# Patient Record
Sex: Female | Born: 1955 | Race: White | Hispanic: No | Marital: Married | Smoking: Never smoker
Health system: Southern US, Community
[De-identification: ages and names within clinical notes are randomized; demographics above are authoritative.]

## PROBLEM LIST (undated history)

## (undated) DIAGNOSIS — G8929 Other chronic pain: Secondary | ICD-10-CM

## (undated) DIAGNOSIS — M419 Scoliosis, unspecified: Secondary | ICD-10-CM

## (undated) DIAGNOSIS — M1990 Unspecified osteoarthritis, unspecified site: Secondary | ICD-10-CM

## (undated) DIAGNOSIS — M549 Dorsalgia, unspecified: Secondary | ICD-10-CM

## (undated) HISTORY — DX: Scoliosis, unspecified: M41.9

## (undated) HISTORY — DX: Other chronic pain: G89.29

## (undated) HISTORY — DX: Unspecified osteoarthritis, unspecified site: M19.90

## (undated) HISTORY — DX: Dorsalgia, unspecified: M54.9

---

## 1983-07-22 HISTORY — PX: FOOT SURGERY: SHX648

## 1998-07-26 ENCOUNTER — Other Ambulatory Visit: Admission: RE | Admit: 1998-07-26 | Discharge: 1998-07-26 | Payer: Self-pay | Admitting: Obstetrics and Gynecology

## 1999-03-15 ENCOUNTER — Emergency Department (HOSPITAL_COMMUNITY): Admission: EM | Admit: 1999-03-15 | Discharge: 1999-03-15 | Payer: Self-pay | Admitting: Emergency Medicine

## 1999-03-15 ENCOUNTER — Encounter: Payer: Self-pay | Admitting: Emergency Medicine

## 1999-03-17 ENCOUNTER — Emergency Department (HOSPITAL_COMMUNITY): Admission: EM | Admit: 1999-03-17 | Discharge: 1999-03-17 | Payer: Self-pay | Admitting: Endocrinology

## 2000-01-28 ENCOUNTER — Other Ambulatory Visit: Admission: RE | Admit: 2000-01-28 | Discharge: 2000-01-28 | Payer: Self-pay | Admitting: *Deleted

## 2001-07-13 ENCOUNTER — Other Ambulatory Visit: Admission: RE | Admit: 2001-07-13 | Discharge: 2001-07-13 | Payer: Self-pay | Admitting: Obstetrics and Gynecology

## 2002-07-20 ENCOUNTER — Encounter: Payer: Self-pay | Admitting: Emergency Medicine

## 2002-07-20 ENCOUNTER — Emergency Department (HOSPITAL_COMMUNITY): Admission: EM | Admit: 2002-07-20 | Discharge: 2002-07-20 | Payer: Self-pay | Admitting: Emergency Medicine

## 2003-03-10 ENCOUNTER — Encounter: Payer: Self-pay | Admitting: Obstetrics and Gynecology

## 2003-03-10 ENCOUNTER — Encounter: Admission: RE | Admit: 2003-03-10 | Discharge: 2003-03-10 | Payer: Self-pay | Admitting: Obstetrics and Gynecology

## 2003-03-10 ENCOUNTER — Other Ambulatory Visit: Admission: RE | Admit: 2003-03-10 | Discharge: 2003-03-10 | Payer: Self-pay | Admitting: Obstetrics and Gynecology

## 2003-06-19 ENCOUNTER — Encounter: Admission: RE | Admit: 2003-06-19 | Discharge: 2003-06-19 | Payer: Self-pay | Admitting: *Deleted

## 2004-04-17 ENCOUNTER — Encounter: Admission: RE | Admit: 2004-04-17 | Discharge: 2004-04-17 | Payer: Self-pay | Admitting: Obstetrics and Gynecology

## 2005-05-05 ENCOUNTER — Ambulatory Visit: Payer: Self-pay | Admitting: Internal Medicine

## 2005-05-29 ENCOUNTER — Encounter: Admission: RE | Admit: 2005-05-29 | Discharge: 2005-05-29 | Payer: Self-pay | Admitting: Obstetrics and Gynecology

## 2005-11-12 DIAGNOSIS — F32A Depression, unspecified: Secondary | ICD-10-CM | POA: Insufficient documentation

## 2005-11-12 DIAGNOSIS — H9190 Unspecified hearing loss, unspecified ear: Secondary | ICD-10-CM | POA: Insufficient documentation

## 2005-12-10 DIAGNOSIS — G4700 Insomnia, unspecified: Secondary | ICD-10-CM | POA: Insufficient documentation

## 2006-06-02 ENCOUNTER — Encounter: Admission: RE | Admit: 2006-06-02 | Discharge: 2006-06-02 | Payer: Self-pay | Admitting: Obstetrics and Gynecology

## 2007-08-20 ENCOUNTER — Encounter: Admission: RE | Admit: 2007-08-20 | Discharge: 2007-08-20 | Payer: Self-pay | Admitting: Obstetrics and Gynecology

## 2008-09-27 DIAGNOSIS — R002 Palpitations: Secondary | ICD-10-CM | POA: Insufficient documentation

## 2009-05-07 ENCOUNTER — Encounter: Admission: RE | Admit: 2009-05-07 | Discharge: 2009-05-07 | Payer: Self-pay | Admitting: Obstetrics and Gynecology

## 2009-07-24 ENCOUNTER — Ambulatory Visit: Payer: Self-pay | Admitting: Family Medicine

## 2010-05-14 ENCOUNTER — Encounter: Admission: RE | Admit: 2010-05-14 | Discharge: 2010-05-14 | Payer: Self-pay | Admitting: Obstetrics and Gynecology

## 2010-08-11 ENCOUNTER — Encounter: Payer: Self-pay | Admitting: Obstetrics and Gynecology

## 2011-05-27 ENCOUNTER — Other Ambulatory Visit: Payer: Self-pay | Admitting: Obstetrics and Gynecology

## 2011-05-27 DIAGNOSIS — Z1231 Encounter for screening mammogram for malignant neoplasm of breast: Secondary | ICD-10-CM

## 2011-06-23 ENCOUNTER — Ambulatory Visit
Admission: RE | Admit: 2011-06-23 | Discharge: 2011-06-23 | Disposition: A | Discharge status: Home / Self Care | Payer: BC Managed Care – PPO | Source: Ambulatory Visit | Attending: Obstetrics and Gynecology | Admitting: Obstetrics and Gynecology

## 2011-06-23 DIAGNOSIS — Z1231 Encounter for screening mammogram for malignant neoplasm of breast: Secondary | ICD-10-CM

## 2011-09-10 LAB — HM PAP SMEAR: HM Pap smear: NEGATIVE

## 2012-11-15 ENCOUNTER — Other Ambulatory Visit: Payer: Self-pay

## 2012-11-15 DIAGNOSIS — Z1231 Encounter for screening mammogram for malignant neoplasm of breast: Secondary | ICD-10-CM

## 2012-12-01 ENCOUNTER — Ambulatory Visit
Admission: RE | Admit: 2012-12-01 | Discharge: 2012-12-01 | Disposition: A | Discharge status: Home / Self Care | Payer: BC Managed Care – PPO | Source: Ambulatory Visit

## 2012-12-01 DIAGNOSIS — Z1231 Encounter for screening mammogram for malignant neoplasm of breast: Secondary | ICD-10-CM

## 2013-12-05 ENCOUNTER — Ambulatory Visit: Payer: Self-pay | Admitting: Family Medicine

## 2013-12-05 LAB — COMPREHENSIVE METABOLIC PANEL
Albumin: 4.1 g/dL (ref 3.4–5.0)
Alkaline Phosphatase: 92 U/L
Anion Gap: 2 — ABNORMAL LOW (ref 7–16)
BUN: 11 mg/dL (ref 7–18)
Bilirubin,Total: 0.7 mg/dL (ref 0.2–1.0)
Calcium, Total: 9.4 mg/dL (ref 8.5–10.1)
Chloride: 105 mmol/L (ref 98–107)
Co2: 31 mmol/L (ref 21–32)
Creatinine: 0.84 mg/dL (ref 0.60–1.30)
EGFR (African American): >60
EGFR (Non-African Amer.): >60
Glucose: 90 mg/dL (ref 65–99)
Osmolality: 275 (ref 275–301)
Potassium: 4.1 mmol/L (ref 3.5–5.1)
SGOT(AST): 19 U/L (ref 15–37)
SGPT (ALT): 31 U/L (ref 12–78)
Sodium: 138 mmol/L (ref 136–145)
Total Protein: 7.5 g/dL (ref 6.4–8.2)

## 2013-12-05 LAB — CBC WITH DIFFERENTIAL/PLATELET
Basophil #: 0 10*3/uL (ref 0.0–0.1)
Basophil %: 0.6 %
Eosinophil #: 0.1 10*3/uL (ref 0.0–0.7)
Eosinophil %: 1.7 %
HCT: 43.6 % (ref 35.0–47.0)
HGB: 14.5 g/dL (ref 12.0–16.0)
Lymphocyte #: 1.6 10*3/uL (ref 1.0–3.6)
Lymphocyte %: 18.6 %
MCH: 30.9 pg (ref 26.0–34.0)
MCHC: 33.3 g/dL (ref 32.0–36.0)
MCV: 93 fL (ref 80–100)
Monocyte #: 0.6 x10 3/mm (ref 0.2–0.9)
Monocyte %: 7 %
Neutrophil #: 6.3 10*3/uL (ref 1.4–6.5)
Neutrophil %: 72.1 %
Platelet: 251 10*3/uL (ref 150–440)
RBC: 4.69 10*6/uL (ref 3.80–5.20)
RDW: 12.3 % (ref 11.5–14.5)
WBC: 8.8 10*3/uL (ref 3.6–11.0)

## 2013-12-05 LAB — TSH: Thyroid Stimulating Horm: 1.9 u[IU]/mL

## 2013-12-13 DIAGNOSIS — R079 Chest pain, unspecified: Secondary | ICD-10-CM

## 2013-12-13 DIAGNOSIS — R002 Palpitations: Secondary | ICD-10-CM

## 2013-12-19 ENCOUNTER — Encounter: Payer: Self-pay | Admitting: Cardiovascular Disease

## 2014-01-03 ENCOUNTER — Other Ambulatory Visit: Payer: Self-pay

## 2014-01-03 DIAGNOSIS — Z1231 Encounter for screening mammogram for malignant neoplasm of breast: Secondary | ICD-10-CM

## 2014-01-06 ENCOUNTER — Ambulatory Visit
Admission: RE | Admit: 2014-01-06 | Discharge: 2014-01-06 | Disposition: A | Discharge status: Home / Self Care | Payer: BC Managed Care – PPO | Source: Ambulatory Visit

## 2014-01-06 DIAGNOSIS — Z1231 Encounter for screening mammogram for malignant neoplasm of breast: Secondary | ICD-10-CM

## 2015-05-03 DIAGNOSIS — G8929 Other chronic pain: Secondary | ICD-10-CM | POA: Insufficient documentation

## 2015-05-03 DIAGNOSIS — R079 Chest pain, unspecified: Secondary | ICD-10-CM | POA: Insufficient documentation

## 2015-05-03 DIAGNOSIS — F4322 Adjustment disorder with anxiety: Secondary | ICD-10-CM | POA: Insufficient documentation

## 2015-05-03 DIAGNOSIS — M549 Dorsalgia, unspecified: Secondary | ICD-10-CM

## 2015-05-04 ENCOUNTER — Ambulatory Visit (INDEPENDENT_AMBULATORY_CARE_PROVIDER_SITE_OTHER): Payer: BLUE CROSS/BLUE SHIELD | Admitting: Family Medicine

## 2015-05-04 ENCOUNTER — Encounter: Payer: Self-pay | Admitting: Family Medicine

## 2015-05-04 VITALS — BP 110/72 | HR 68 | Temp 98.5°F | Resp 16 | Ht 68.5 in | Wt 181.0 lb

## 2015-05-04 DIAGNOSIS — G4700 Insomnia, unspecified: Secondary | ICD-10-CM

## 2015-05-04 DIAGNOSIS — M1990 Unspecified osteoarthritis, unspecified site: Secondary | ICD-10-CM

## 2015-05-04 MED ORDER — MELOXICAM 15 MG PO TABS: 15.0000 mg | ORAL_TABLET | Freq: Every day | ORAL | Status: DC

## 2015-05-04 NOTE — Progress Notes (Signed)
Subjective:    Patient ID: Robyn Roberts, female    DOB: Feb 26, 1956, 59 y.o.   MRN: [id number]  HPI  Joint/Muscle Pain: Patient complains of arthralgias for which has been present for several months. Pain is located in multiple joints (bilateral thumbs, back, neck, hips), is described as tight band, and is constant . Thumbs are the worse pain this week. Neck cracking  Not worse in the am.   Associated symptoms include: crepitation and decreased range of motion.  The patient has tried NSAIDs for pain, with moderate relief.  Related to injury:   no. No family history of rheumatoid arthritis, though there is a family h/o osteoarthritis. Has taken Ibuprofen every day.  Also, doing well on Trazodone for sleep. Has stopped Metoprolol and Lexapro as anxiety has resolved since passing test.   Did not need medication anymore. Working as Statistician.      Review of Systems  Constitutional: Negative for fever, chills, diaphoresis, activity change, appetite change, fatigue and unexpected weight change.  Respiratory: Negative for cough, shortness of breath and wheezing.   Cardiovascular: Negative for chest pain, palpitations and leg swelling.  Musculoskeletal: Positive for back pain, arthralgias, neck pain and neck stiffness. Negative for joint swelling.   BP 110/72 mmHg  Pulse 68  Temp(Src) 98.5 F (36.9 C) (Oral)  Resp 16  Ht 5' 8.5" (1.74 m)  Wt 181 lb (82.101 kg)  BMI 27.12 kg/m2   Patient Active Problem List   Diagnosis Date Noted  . Adjustment disorder with anxiety 05/03/2015  . Back ache 05/03/2015  . Chest pain 05/03/2015  . Awareness of heartbeats 09/27/2008  . Cannot sleep 12/10/2005  . Clinical depression 11/12/2005  . Difficulty hearing 11/12/2005   No past medical history on file. Current Outpatient Prescriptions on File Prior to Visit  Medication Sig  . MULTIPLE VITAMIN PO Take 1 tablet by mouth daily.  . traZODone (DESYREL) 100 MG tablet Take 1 tablet by mouth  at bedtime.  . fexofenadine (ALLEGRA ALLERGY) 180 MG tablet Take 1 tablet by mouth daily.  . metoprolol tartrate (LOPRESSOR) 25 MG tablet Take 1 tablet by mouth 2 (two) times daily.   No current facility-administered medications on file prior to visit.   Allergies  Allergen Reactions  . Aspirin Swelling  . Penicillins Swelling  . Sulfa Antibiotics Hives   Past Surgical History  Procedure Laterality Date  . Cesarean section  1991  . Foot surgery Bilateral 1985    bunionectomy   Social History   Social History  . Marital Status: Married    Spouse Name: N/A  . Number of Children: N/A  . Years of Education: N/A   Occupational History  . Not on file.   Social History Main Topics  . Smoking status: Never Smoker   . Smokeless tobacco: Never Used  . Alcohol Use: No  . Drug Use: No  . Sexual Activity: Not on file   Other Topics Concern  . Not on file   Social History Narrative   Family History  Problem Relation Age of Onset  . Heart disease Mother   . Cancer Mother     cancer  . Colon polyps Mother   . Cancer Father     colon  . Diabetes Father   . Depression Father   . CVA Father       Objective:   Physical Exam  Constitutional: She appears well-developed and well-nourished.  HENT:  Head: Normocephalic and atraumatic.  Eyes: Conjunctivae  are normal. Pupils are equal, round, and reactive to light.  Neck: Normal range of motion. Neck supple.  Cardiovascular: Normal rate and regular rhythm.   Pulmonary/Chest: Effort normal and breath sounds normal.  Musculoskeletal: Normal range of motion. She exhibits no edema.  Some tenderness at first MCP joints.  Some swelling.   No erythema.  Strength intact.   Lymphadenopathy:    She has no cervical adenopathy.  Skin: Skin is warm and dry. Rash (Around nails from picking. ) noted.  BP 110/72 mmHg  Pulse 68  Temp(Src) 98.5 F (36.9 C) (Oral)  Resp 16  Ht 5' 8.5" (1.74 m)  Wt 181 lb (82.101 kg)  BMI 27.12 kg/m2      Assessment & Plan:  1. Arthritis New problem. Suspects osteoarthritis, but will check for other underlying etiologies.  Will also start anti-inflammatory to see if helps.  Sent in rx for Mobic. - CBC with Differential/Platelet - Comprehensive metabolic panel - CYCLIC CITRUL PEPTIDE ANTIBODY, IGG/IGA - Rheumatoid Factor - Sed Rate (ESR)  2. Cannot sleep Stable on Trazodone. Stable. Follow up as needed.   Margarita Rana, MD

## 2015-05-06 LAB — CBC WITH DIFFERENTIAL/PLATELET
Basophils Absolute: 0 10*3/uL (ref 0.0–0.2)
Basos: 0 %
EOS (ABSOLUTE): 0.2 10*3/uL (ref 0.0–0.4)
Eos: 2 %
Hematocrit: 43.8 % (ref 34.0–46.6)
Hemoglobin: 14.6 g/dL (ref 11.1–15.9)
Immature Grans (Abs): 0 10*3/uL (ref 0.0–0.1)
Immature Granulocytes: 0 %
Lymphocytes Absolute: 1.6 10*3/uL (ref 0.7–3.1)
Lymphs: 18 %
MCH: 30.8 pg (ref 26.6–33.0)
MCHC: 33.3 g/dL (ref 31.5–35.7)
MCV: 92 fL (ref 79–97)
Monocytes Absolute: 0.7 10*3/uL (ref 0.1–0.9)
Monocytes: 7 %
Neutrophils Absolute: 6.6 10*3/uL (ref 1.4–7.0)
Neutrophils: 73 %
Platelets: 287 10*3/uL (ref 150–379)
RBC: 4.74 x10E6/uL (ref 3.77–5.28)
RDW: 12.5 % (ref 12.3–15.4)
WBC: 9.2 10*3/uL (ref 3.4–10.8)

## 2015-05-06 LAB — COMPREHENSIVE METABOLIC PANEL
ALT: 40 IU/L — ABNORMAL HIGH (ref 0–32)
AST: 21 IU/L (ref 0–40)
Albumin/Globulin Ratio: 1.8 (ref 1.1–2.5)
Albumin: 4.3 g/dL (ref 3.5–5.5)
Alkaline Phosphatase: 109 IU/L (ref 39–117)
BUN/Creatinine Ratio: 17 (ref 9–23)
BUN: 15 mg/dL (ref 6–24)
Bilirubin Total: 0.5 mg/dL (ref 0.0–1.2)
CO2: 25 mmol/L (ref 18–29)
Calcium: 9.6 mg/dL (ref 8.7–10.2)
Chloride: 102 mmol/L (ref 97–108)
Creatinine, Ser: 0.87 mg/dL (ref 0.57–1.00)
GFR calc Af Amer: 86 mL/min/{1.73_m2} (ref >59)
GFR calc non Af Amer: 74 mL/min/{1.73_m2} (ref >59)
Globulin, Total: 2.4 g/dL (ref 1.5–4.5)
Glucose: 103 mg/dL — ABNORMAL HIGH (ref 65–99)
Potassium: 4.7 mmol/L (ref 3.5–5.2)
Sodium: 143 mmol/L (ref 134–144)
Total Protein: 6.7 g/dL (ref 6.0–8.5)

## 2015-05-06 LAB — RHEUMATOID FACTOR: Rhuematoid fact SerPl-aCnc: <10 IU/mL (ref 0.0–13.9)

## 2015-05-06 LAB — CYCLIC CITRUL PEPTIDE ANTIBODY, IGG/IGA: Cyclic Citrullin Peptide Ab: 9 units (ref 0–19)

## 2015-05-06 LAB — SEDIMENTATION RATE: Sed Rate: 2 mm/hr (ref 0–40)

## 2015-05-08 ENCOUNTER — Telehealth: Payer: Self-pay

## 2015-05-08 NOTE — Telephone Encounter (Signed)
-----   Message from Lorie PhenixNancy Maloney, MD sent at 05/06/2015 10:35 AM EDT ----- Labs stable except for one elevated liver enzyme.  No signs of inflammatory arthritis.  Liver enzymes often return to normal on their own.  No tylenol or ETOH and recheck in one month.

## 2015-05-08 NOTE — Telephone Encounter (Signed)
Pt advised as directed below.  She is going to call in about a month to get her lab sheet.   Thanks,   -Vernona RiegerLaura

## 2015-05-08 NOTE — Telephone Encounter (Signed)
Tried calling; no answer 05/08/2015  Thanks,   -Troi Bechtold  

## 2015-08-18 ENCOUNTER — Other Ambulatory Visit: Payer: Self-pay | Admitting: Family Medicine

## 2015-08-18 DIAGNOSIS — G4700 Insomnia, unspecified: Secondary | ICD-10-CM

## 2015-09-10 ENCOUNTER — Other Ambulatory Visit: Payer: Self-pay

## 2015-09-10 MED ORDER — MELOXICAM 15 MG PO TABS: 15.0000 mg | ORAL_TABLET | Freq: Every day | ORAL | Status: DC

## 2015-09-10 NOTE — Telephone Encounter (Signed)
Mail order pharmacy requesting refill of 90 day supply. Last OV was 05/04/2015. Med was last filled on 05/04/2015 #30 with 5 RF.

## 2015-09-24 ENCOUNTER — Other Ambulatory Visit: Payer: Self-pay

## 2015-09-24 DIAGNOSIS — Z1231 Encounter for screening mammogram for malignant neoplasm of breast: Secondary | ICD-10-CM

## 2015-10-05 ENCOUNTER — Ambulatory Visit
Admission: RE | Admit: 2015-10-05 | Discharge: 2015-10-05 | Disposition: A | Discharge status: Home / Self Care | Payer: BLUE CROSS/BLUE SHIELD | Source: Ambulatory Visit

## 2015-10-05 DIAGNOSIS — Z1231 Encounter for screening mammogram for malignant neoplasm of breast: Secondary | ICD-10-CM

## 2015-10-09 ENCOUNTER — Telehealth: Payer: Self-pay

## 2015-10-09 ENCOUNTER — Telehealth: Payer: Self-pay | Admitting: Family Medicine

## 2015-10-09 NOTE — Telephone Encounter (Signed)
Patient called saying that she has neck pain that started about 1 week ago. She feels like her pain is coming from the Meloxicam. She reports that she has been on Meloxicam since October. She reports that she intially got her medication [facility] at [facility] in [place] and she received a round pill. She now gets it through mail order, and reports that it is an oval shaped pill. She has been on the oval pill for the last 3 weeks. She thinks the medication change is causing her symptoms. I suggested that patient stop the Meloxicam for a few days to see if her neck pain resolves (what do you think?). Patient takes Meloxicam for arthritis in her hands. What can she take in the mean time to help with pain being that she is allergic to aspirin? I thought of Aleve but I wasn't sure. Please advise. Thanks!

## 2015-10-09 NOTE — Telephone Encounter (Signed)
Pt has been taking melexacom for several months with no side effects.  In the last couple days she has been getting a discomfort in the neck area.

## 2015-10-09 NOTE — Telephone Encounter (Signed)
Likely arthritis or muscle spasm and not the medication. Can stop and see if improves. Would just try Tylenol for pain. OV if worsens or does not improve. Thanks.

## 2015-10-10 NOTE — Telephone Encounter (Signed)
Pt returned your call. ° °ThanksTeri °

## 2015-10-10 NOTE — Telephone Encounter (Signed)
Tried calling patient, no answer. Will try again later.  

## 2015-10-11 NOTE — Telephone Encounter (Signed)
Tried calling patient and voicemail has not been set up. Will try again later.  

## 2015-10-19 NOTE — Telephone Encounter (Signed)
Tried calling patient and voicemail has not been set up. Will try again later.  

## 2015-10-25 NOTE — Telephone Encounter (Signed)
Tried calling patient and was unsuccessful on several attempts. Unable to leave message regarding below.

## 2015-10-30 DIAGNOSIS — D224 Melanocytic nevi of scalp and neck: Secondary | ICD-10-CM | POA: Diagnosis not present

## 2015-10-30 DIAGNOSIS — L821 Other seborrheic keratosis: Secondary | ICD-10-CM | POA: Diagnosis not present

## 2015-10-30 DIAGNOSIS — L814 Other melanin hyperpigmentation: Secondary | ICD-10-CM | POA: Diagnosis not present

## 2016-03-10 ENCOUNTER — Ambulatory Visit (INDEPENDENT_AMBULATORY_CARE_PROVIDER_SITE_OTHER): Payer: BLUE CROSS/BLUE SHIELD | Admitting: Family Medicine

## 2016-03-10 ENCOUNTER — Encounter: Payer: Self-pay | Admitting: Family Medicine

## 2016-03-10 VITALS — BP 122/80 | HR 80 | Temp 97.9°F | Resp 16 | Wt 183.0 lb

## 2016-03-10 DIAGNOSIS — J069 Acute upper respiratory infection, unspecified: Secondary | ICD-10-CM

## 2016-03-10 MED ORDER — HYDROCODONE-HOMATROPINE 5-1.5 MG/5ML PO SYRP: ORAL_SOLUTION | ORAL | 0 refills | Status: DC

## 2016-03-10 NOTE — Progress Notes (Signed)
Subjective:     Patient ID: Robyn Roberts, female   DOB: 17-Apr-1956, 60 y.o.   MRN: [id number]  HPI  Chief Complaint  Patient presents with  . URI    x 5 days. Afebrile. Cough (dry), sore throat, scratchy ears. Pt has tried Robitussin and Ibuprofen, with mild relief.     Review of Systems  Constitutional: Negative for chills and fever.       Objective:   Physical Exam  Constitutional: She appears well-developed and well-nourished. No distress.  Ears: T.M's intact without inflammation Throat: no tonsillar enlargement or exudate Neck: no cervical adenopathy Lungs: clear    Assessment:    1. Upper respiratory infection - HYDROcodone-homatropine (HYCODAN) 5-1.5 MG/5ML syrup; 5 ml 4-6 hours as needed for cough  Dispense: 240 mL; Refill: 0    Plan:    Discussed use of otc medication.

## 2016-03-10 NOTE — Patient Instructions (Signed)
Discussed use of Mucinex D for congestion, Delsym for cough, and Benadryl for postnasal drainage 

## 2016-04-03 ENCOUNTER — Ambulatory Visit (INDEPENDENT_AMBULATORY_CARE_PROVIDER_SITE_OTHER): Payer: BLUE CROSS/BLUE SHIELD | Admitting: Family Medicine

## 2016-04-03 ENCOUNTER — Encounter: Payer: Self-pay | Admitting: Family Medicine

## 2016-04-03 VITALS — BP 115/79 | HR 67 | Ht 68.25 in | Wt 179.6 lb

## 2016-04-03 DIAGNOSIS — G4700 Insomnia, unspecified: Secondary | ICD-10-CM | POA: Diagnosis not present

## 2016-04-03 DIAGNOSIS — J3089 Other allergic rhinitis: Secondary | ICD-10-CM | POA: Diagnosis not present

## 2016-04-03 DIAGNOSIS — Z800 Family history of malignant neoplasm of digestive organs: Secondary | ICD-10-CM

## 2016-04-03 DIAGNOSIS — E663 Overweight: Secondary | ICD-10-CM

## 2016-04-03 DIAGNOSIS — M159 Polyosteoarthritis, unspecified: Secondary | ICD-10-CM

## 2016-04-03 DIAGNOSIS — M4120 Other idiopathic scoliosis, site unspecified: Secondary | ICD-10-CM | POA: Insufficient documentation

## 2016-04-03 HISTORY — DX: Family history of malignant neoplasm of digestive organs: Z80.0

## 2016-04-03 NOTE — Patient Instructions (Addendum)
Melatonin 3mg  to start- up to 15mg  and also take trazadone current dose.    Sleep meditation on youtube.:   Guided meditation for detachment from overthinking by Kristopher Glee, hypnosis for clearing subconscious negativity by Kristopher Glee and guided meditation for sleep floating amongst the stars by Rayvon Char     If you have insomnia or difficulty sleeping, this information is for you:  - Avoid caffeinated beverages after lunch,  no alcoholic beverages,  no eating within 2-3 hours of lying down,  avoid exposure to blue light before bed,  avoid daytime naps, and  needs to maintain a regular sleep schedule- go to sleep and wake up around the same time every night.   - Resolve concerns or worries before entering bedroom:  Discussed relaxation techniques with patient and to keep a journal to write down fears\ worries.  I suggested seeing a counselor for CBT.   - Recommend patient meditate or do deep breathing exercises to help relax.   Incorporate the use of white noise machines or listen to "sleep meditation music", or recordings of guided meditations for sleep from YouTube which are free, such as  "guided meditation for detachment from over thinking"  by Ina Kick.       Insomnia Insomnia is a sleep disorder that makes it difficult to fall asleep or to stay asleep. Insomnia can cause tiredness (fatigue), low energy, difficulty concentrating, mood swings, and poor performance at work or school.  There are three different ways to classify insomnia:  Difficulty falling asleep.  Difficulty staying asleep.  Waking up too early in the morning. Any type of insomnia can be long-term (chronic) or short-term (acute). Both are common. Short-term insomnia usually lasts for three months or less. Chronic insomnia occurs at least three times a week for longer than three months. CAUSES  Insomnia may be caused by another condition, situation, or substance, such as:  Anxiety.  Certain  medicines.  Gastroesophageal reflux disease (GERD) or other gastrointestinal conditions.  Asthma or other breathing conditions.  Restless legs syndrome, sleep apnea, or other sleep disorders.  Chronic pain.  Menopause. This may include hot flashes.  Stroke.  Abuse of alcohol, tobacco, or illegal drugs.  Depression.  Caffeine.   Neurological disorders, such as Alzheimer disease.  An overactive thyroid (hyperthyroidism). The cause of insomnia may not be known. RISK FACTORS Risk factors for insomnia include:  Gender. Women are more commonly affected than men.  Age. Insomnia is more common as you get older.  Stress. This may involve your professional or personal life.  Income. Insomnia is more common in people with lower income.  Lack of exercise.   Irregular work schedule or night shifts.  Traveling between different time zones. SIGNS AND SYMPTOMS If you have insomnia, trouble falling asleep or trouble staying asleep is the main symptom. This may lead to other symptoms, such as:  Feeling fatigued.  Feeling nervous about going to sleep.  Not feeling rested in the morning.  Having trouble concentrating.  Feeling irritable, anxious, or depressed. TREATMENT  Treatment for insomnia depends on the cause. If your insomnia is caused by an underlying condition, treatment will focus on addressing the condition. Treatment may also include:   Medicines to help you sleep.  Counseling or therapy.  Lifestyle adjustments. HOME CARE INSTRUCTIONS   Take medicines only as directed by your health care provider.  Keep regular sleeping and waking hours. Avoid naps.  Keep a sleep diary to help you and your health care provider  figure out what could be causing your insomnia. Include:   When you sleep.  When you wake up during the night.  How well you sleep.   How rested you feel the next day.  Any side effects of medicines you are taking.  What you eat and  drink.   Make your bedroom a comfortable place where it is easy to fall asleep:  Put up shades or special blackout curtains to block light from outside.  Use a white noise machine to block noise.  Keep the temperature cool.   Exercise regularly as directed by your health care provider. Avoid exercising right before bedtime.  Use relaxation techniques to manage stress. Ask your health care provider to suggest some techniques that may work well for you. These may include:  Breathing exercises.  Routines to release muscle tension.  Visualizing peaceful scenes.  Cut back on alcohol, caffeinated beverages, and cigarettes, especially close to bedtime. These can disrupt your sleep.  Do not overeat or eat spicy foods right before bedtime. This can lead to digestive discomfort that can make it hard for you to sleep.  Limit screen use before bedtime. This includes:  Watching TV.  Using your smartphone, tablet, and computer.  Stick to a routine. This can help you fall asleep faster. Try to do a quiet activity, brush your teeth, and go to bed at the same time each night.  Get out of bed if you are still awake after 15 minutes of trying to sleep. Keep the lights down, but try reading or doing a quiet activity. When you feel sleepy, go back to bed.  Make sure that you drive carefully. Avoid driving if you feel very sleepy.  Keep all follow-up appointments as directed by your health care provider. This is important. SEEK MEDICAL CARE IF:   You are tired throughout the day or have trouble in your daily routine due to sleepiness.  You continue to have sleep problems or your sleep problems get worse. SEEK IMMEDIATE MEDICAL CARE IF:   You have serious thoughts about hurting yourself or someone else.   This information is not intended to replace advice given to you by your health care provider. Make sure you discuss any questions you have with your health care provider.   Document  Released: 07/04/2000 Document Revised: 03/28/2015 Document Reviewed: 04/07/2014 Elsevier Interactive Patient Education [employer]2016 Elsevier Inc.

## 2016-04-03 NOTE — Assessment & Plan Note (Signed)
Been on trazadone many yrs.  Last 1 yr- little help. Takes forever to fall sleep and freq awakenings.  Longest time is 3 hrs sleeping.  No history OSA or eval for that but snores.

## 2016-04-03 NOTE — Assessment & Plan Note (Deleted)
In BL hands

## 2016-04-03 NOTE — Progress Notes (Signed)
New patient office visit note:  Impression and Recommendations:    1. Overweight (BMI 25.0-29.9)   2. Insomnia   3. Family history of colon cancer- father age late 32's   4. Environmental and seasonal allergies   5. Generalized OA- hands, back, neck, hips, knees     Insomnia Been on trazadone many yrs.  Last 1 yr- little help. Takes forever to fall sleep and freq awakenings.  Longest time is 3 hrs sleeping.  No history OSA or eval for that but snores.    up-to-date on her colonoscopy per patient- asked her to get me these medical records  Environmental seasonal allergies- advised Nettie pot twice a day, Flonase 1 squirt after each saline rinse of her sinuses. Can continue over-the-counter medications- Allegra   Arthritis-multi joint and most prominent in hands today. Patient has used Mobic with decent control. She does not exercise regularly.   Orders Placed This Encounter  Procedures  . CBC with Differential/Platelet  . COMPLETE METABOLIC PANEL WITH GFR  . Lipid panel  . TSH  . VITAMIN D 25 Hydroxy (Vit-D Deficiency, Fractures)  . Hemoglobin A1c      New Prescriptions   No medications on file    Modified Medications   No medications on file    Discontinued Medications   HYDROCODONE-HOMATROPINE (HYCODAN) 5-1.5 MG/5ML SYRUP    5 ml 4-6 hours as needed for cough   The patient was counseled, risk factors were discussed, anticipatory guidance given.  Gross side effects, risk and benefits, and alternatives of medications discussed with patient.  Patient is aware that all medications have potential side effects and we are unable to predict every side effect or drug-drug interaction that may occur.  Expresses verbal understanding and consents to current therapy plan and treatment regimen.  Return in about 4 weeks (around 05/01/2016) for Fasting blood work, For wellness exam & health maintenance evaluation.  Please see AVS handed out to patient at the end of  our visit for further patient instructions/ counseling done pertaining to today's office visit.    Note: This document was prepared using Dragon voice recognition software and may include unintentional dictation errors.  ----------------------------------------------------------------------------------------------------------------------    Subjective:    Chief Complaint  Patient presents with  . Establish Care    HPI: Robyn Roberts is a pleasant 60 y.o. female who presents to [facility] today to review their medical history with me and establish care.    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 Married to [place]- 1 D- 59yo and 1 mo old granddaughter.   No history smoking. Does not drink, no recreational drugs. Only sexually active with husband. No regular exercise.  Social, family, surgical, personal medical and social economic history reviewed with patient.   Primary complaint today is with her sleep. Has had difficulty for some time now. She had done well on trazodone for many years.   History of anxiety which she took Lexapro and metoprolol   Complains of arthritis in both hands, back, neck, hips - is multi-joint.. This is been going on for years. Rheumatoid workup was negative.     Wt Readings from Last 3 Encounters:  04/03/16 179 lb 9.6 oz (81.5 kg)  03/10/16 183 lb (83 kg)  05/04/15 181 lb (82.1 kg)   BP Readings from Last 3 Encounters:  04/03/16 115/79  03/10/16 122/80  05/04/15 110/72   Pulse Readings from Last 3  Encounters:  04/03/16 67  03/10/16 80  05/04/15 68   BMI Readings from Last 3 Encounters:  04/03/16 27.11 kg/m  03/10/16 27.42 kg/m  05/04/15 27.12 kg/m      Patient Active Problem List   Diagnosis Date Noted  . Environmental and seasonal allergies 04/05/2016  . Generalized OA- hands, back, neck, hips, knees 04/05/2016  . Overweight (BMI 25.0-29.9) 04/05/2016  . Idiopathic scoliosis  04/03/2016  . Family history of colon cancer- father age late 6050's 04/03/2016  . Chronic back pain 05/03/2015  . Insomnia 12/10/2005  . Difficulty hearing 11/12/2005     Past Medical History:  Diagnosis Date  . Arthritis   . Chronic back pain   . Scoliosis      Past Medical History:  Diagnosis Date  . Arthritis   . Chronic back pain   . Scoliosis      Past Surgical History:  Procedure Laterality Date  . CESAREAN SECTION  1991  . FOOT SURGERY Bilateral 1985   bunionectomy     Family History  Problem Relation Age of Onset  . Heart disease Mother   . Colon polyps Mother   . Cancer Mother     thyroid  . Asthma Mother   . Cancer Father     colon  . Diabetes Father   . Depression Father   . CVA Father      History  Drug Use No    History  Alcohol Use  . Yes    Comment: rare    History  Smoking Status  . Never Smoker  Smokeless Tobacco  . Never Used     Patient's Medications  New Prescriptions   No medications on file  Previous Medications   FEXOFENADINE (ALLEGRA ALLERGY) 180 MG TABLET    Take 1 tablet by mouth daily.   MELOXICAM (MOBIC) 15 MG TABLET    Take 1 tablet (15 mg total) by mouth daily.   MULTIPLE VITAMIN PO    Take 1 tablet by mouth daily.   TRAZODONE (DESYREL) 100 MG TABLET    TAKE 1 TABLET EVERY EVENING  Modified Medications   No medications on file  Discontinued Medications   HYDROCODONE-HOMATROPINE (HYCODAN) 5-1.5 MG/5ML SYRUP    5 ml 4-6 hours as needed for cough    Allergies: Aspirin; Penicillins; and Sulfa antibiotics  Review of Systems  Constitutional: Negative.   HENT: Positive for hearing loss.        Allergies  Eyes: Negative.   Respiratory: Negative.   Cardiovascular: Negative.   Gastrointestinal: Negative.   Genitourinary: Negative.   Musculoskeletal: Positive for back pain.       Back pain and joint pain of a chronic nature  Skin: Negative.   Neurological: Negative.   Endo/Heme/Allergies: Negative.     Psychiatric/Behavioral: Negative.      Objective:    Blood pressure 115/79, pulse 67, height 5' 8.25" (1.734 m), weight 179 lb 9.6 oz (81.5 kg). Body mass index is 27.11 kg/m. General: Well Developed, well nourished, and in no acute distress.  Neuro: Alert and oriented x3, extra-ocular muscles intact, sensation grossly intact.  HEENT: Normocephalic, atraumatic, pupils equal round reactive to light, neck supple, no gross masses, no carotid bruits, no JVD apprec Skin: no gross suspicious lesions or rashes  Cardiac: Regular rate and rhythm, no murmurs rubs or gallops.  Respiratory: Essentially clear to auscultation bilaterally. Not using accessory muscles, speaking in full sentences.  Abdominal: Soft, not grossly distended Musculoskeletal: Ambulates w/o diff, FROM *  4 ext.  Vasc: less 2 sec cap RF, warm and pink  Psych:  No HI/SI, judgement and insight good, Euthymic mood. Full Affect.

## 2016-04-05 DIAGNOSIS — M159 Polyosteoarthritis, unspecified: Secondary | ICD-10-CM | POA: Insufficient documentation

## 2016-04-05 DIAGNOSIS — J3089 Other allergic rhinitis: Secondary | ICD-10-CM | POA: Insufficient documentation

## 2016-04-05 DIAGNOSIS — E663 Overweight: Secondary | ICD-10-CM | POA: Insufficient documentation

## 2016-05-19 ENCOUNTER — Encounter: Payer: Self-pay | Admitting: Family Medicine

## 2016-05-19 ENCOUNTER — Other Ambulatory Visit: Payer: Self-pay | Admitting: Family Medicine

## 2016-05-19 ENCOUNTER — Ambulatory Visit (INDEPENDENT_AMBULATORY_CARE_PROVIDER_SITE_OTHER): Payer: BLUE CROSS/BLUE SHIELD | Admitting: Family Medicine

## 2016-05-19 VITALS — BP 130/82 | HR 61 | Ht 68.25 in | Wt 181.4 lb

## 2016-05-19 DIAGNOSIS — M1990 Unspecified osteoarthritis, unspecified site: Secondary | ICD-10-CM | POA: Diagnosis not present

## 2016-05-19 DIAGNOSIS — Z1211 Encounter for screening for malignant neoplasm of colon: Secondary | ICD-10-CM

## 2016-05-19 DIAGNOSIS — Z1389 Encounter for screening for other disorder: Secondary | ICD-10-CM | POA: Diagnosis not present

## 2016-05-19 DIAGNOSIS — G4700 Insomnia, unspecified: Secondary | ICD-10-CM | POA: Diagnosis not present

## 2016-05-19 DIAGNOSIS — N951 Menopausal and female climacteric states: Secondary | ICD-10-CM | POA: Diagnosis not present

## 2016-05-19 DIAGNOSIS — M545 Low back pain: Secondary | ICD-10-CM

## 2016-05-19 DIAGNOSIS — Z0000 Encounter for general adult medical examination without abnormal findings: Secondary | ICD-10-CM

## 2016-05-19 DIAGNOSIS — G8929 Other chronic pain: Secondary | ICD-10-CM

## 2016-05-19 DIAGNOSIS — E663 Overweight: Secondary | ICD-10-CM

## 2016-05-19 DIAGNOSIS — M180 Bilateral primary osteoarthritis of first carpometacarpal joints: Secondary | ICD-10-CM | POA: Diagnosis not present

## 2016-05-19 DIAGNOSIS — Z23 Encounter for immunization: Secondary | ICD-10-CM

## 2016-05-19 DIAGNOSIS — Z800 Family history of malignant neoplasm of digestive organs: Secondary | ICD-10-CM

## 2016-05-19 DIAGNOSIS — M159 Polyosteoarthritis, unspecified: Secondary | ICD-10-CM

## 2016-05-19 MED ORDER — TRAZODONE HCL 100 MG PO TABS: 100.0000 mg | ORAL_TABLET | Freq: Every evening | ORAL | 2 refills | Status: DC
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
## 2016-05-19 NOTE — Patient Instructions (Addendum)
Ice thumbs 3-4 times/ day 15-20 min at a time prn pain  - use thumb splints at nights and during day if painful  If it gets worse and/or no improvement despite this treatment plan then we can always send you to a sports medicine person who would be able to inject those joints with steroid to calm down the inflammation and pain.   History of This [employer] Not too long ago I was given antibiotics for a throbbing volcano of pain commonly referred to as a toothache. Thanks to a late diagnosis, the medicine was taking several days to kick in. I was visiting my parents at the time and my mom had a big ole flexible ice pack that soon became a permanent attachment to my face. When I went home, I didn't have one so I decided to use crushed ice in a zip bag. This worked fine until the night when I woke up with a leaky bag and a sopping wet pillow. Therefore, I decided I would make my own homemade ice pack out of materials I had on hand. I knew that if I added enough alcohol to water, it would prevent it from freezing solid. So I tested it, and it worked great. These ice packs are reusable and super easy to make. Mine is still sitting in my freezer waiting for its next opportunity to shine. Here's how easy it is. Flexible Homemade Ice Pack 2 cups water 1 cup rubbing alcohol or high-proof vodka (I used Everclear) 05/19/2016 Homemade Ice Pack: A Natural Flexible and Reusable Solution [employer] 4/12 food coloring for the blue tint (optional) 2 zip-top bags - quart or gallon-size or vacuum sealer bags Mix the water and alcohol together in one of your zip-top bags and add food coloring, if desired, until you get that perfect blue tint. Next, release as much air as possible and seal the bag. I recommend double bagging for strength. (If you have a vacuum sealer use a vacuum seal bag for the outer layer to further prevent accidents.) My trusty Etheleen Mayhew works  like a Nutritional therapist. After my leaky bag/wet pillow incident I wasn't taking any chances! Stick your new flexible homemade alcohol ice pack in the freezer for about 12 hours before using it for the first time. Once frozen it will be icy, a little slushy, and perfectly flexible for any body injury that needs the cold treatment. NOTE: When you use this be sure to put a cloth between your skin and the pack - these are very cold! A Few Tips and Tricks If you use a quart size zip-top bag, your ice pack will be more square shaped, and a gallon-size will give you a longer more narrow pack. Use whichever you prefer. I like the gallon size better because they wrap around body parts better. Different alcohols will give different results. Rubbing alcohol is around 70% alcohol, and the Everclear I used is about 95% alcohol. So, you may want to freeze the inner bag first to make sure you like the amount of flexibility before committing to permanent double-bagging. The general rule is - more alcohol = slushier ice pack. Cost Savings Breakdown The savings for making your own homemade ice pack is significant. Flexible ice packs generally run between $10 and $14 a piece. The approximate material costs (at time of writing) for the homemade pack are: 05/19/2016 Homemade Ice Pack: A Natural Flexible and Reusable Solution [email or web address] 5/12 About [address] to inspire others to save money  and live more sustainably. He is passionate about eating local, living simply, and doing more things Water - 2 Rubbing Alcohol - 75 Gallon zip top bag - 13 Food Vacuum Sealer Bag - 38 Total Cost: - $1.28 Total Savings: - $8.72-$13.72, or 87%-91%. The ice packs are reusable indefinitely (unless punctured) and can be used just as a commercial ice pack. There you have it. a quick, easy, and inexpensive recipe for homemade ice packs. The project is fun and well worth the 15 minutes for  a 90% savings.     Preventive Care for Adults, Female A healthy lifestyle and preventive care can promote health and wellness. Preventive health guidelines for women include the following key practices.   A routine yearly physical is a good way to check with your health care provider about your health and preventive screening. It is a chance to share any concerns and updates on your health and to receive a thorough exam.   Visit your dentist for a routine exam and preventive care every 6 months. Brush your teeth twice a day and floss once a day. Good oral hygiene prevents tooth decay and gum disease.   The frequency of eye exams is based on your age, health, family medical history, use of contact lenses, and other factors. Follow your health care provider's recommendations for frequency of eye exams.   Eat a healthy diet. Foods like vegetables, fruits, whole grains, low-fat dairy products, and lean protein foods contain the nutrients you need without too many calories. Decrease your intake of foods high in solid fats, added sugars, and salt. Eat the right amount of calories for you.Get information about a proper diet from your health care provider, if necessary.   Regular physical exercise is one of the most important things you can do for your health. Most adults should get at least 150 minutes of moderate-intensity exercise (any activity that increases your heart rate and causes you to sweat) each week. In addition, most adults need muscle-strengthening exercises on 2 or more days a week.   Maintain a healthy weight. The body mass index (BMI) is a screening tool to identify possible weight problems. It provides an estimate of body fat based on height and weight. Your health care provider can find your BMI, and can help you achieve or maintain a healthy weight.For adults 20 years and older:   - A BMI below 18.5 is considered underweight.   - A BMI of 18.5 to 24.9 is normal.   - A BMI of  25 to 29.9 is considered overweight.   - A BMI of 30 and above is considered obese.   Maintain normal blood lipids and cholesterol levels by exercising and minimizing your intake of trans and saturated fats.  Eat a balanced diet with plenty of fruit and vegetables. Blood tests for lipids and cholesterol should begin at age 29 and be repeated every 5 years minimum.  If your lipid or cholesterol levels are high, you are over 40, or you are at high risk for heart disease, you may need your cholesterol levels checked more frequently.Ongoing high lipid and cholesterol levels should be treated with medicines if diet and exercise are not working.   If you smoke, find out from your health care provider how to quit. If you do not use tobacco, do not start.   Lung cancer screening is recommended for adults aged 72-80 years who are at high risk for developing lung cancer because of a history of smoking.  A yearly low-dose CT scan of the lungs is recommended for people who have at least a 30-pack-year history of smoking and are a current smoker or have quit within the past 15 years. A pack year of smoking is smoking an average of 1 pack of cigarettes a day for 1 year (for example: 1 pack a day for 30 years or 2 packs a day for 15 years). Yearly screening should continue until the smoker has stopped smoking for at least 15 years. Yearly screening should be stopped for people who develop a health problem that would prevent them from having lung cancer treatment.   If you are pregnant, do not drink alcohol. If you are breastfeeding, be very cautious about drinking alcohol. If you are not pregnant and choose to drink alcohol, do not have more than 1 drink per day. One drink is considered to be 12 ounces (355 mL) of beer, 5 ounces (148 mL) of wine, or 1.5 ounces (44 mL) of liquor.   Avoid use of street drugs. Do not share needles with anyone. Ask for help if you need support or instructions about stopping the use of  drugs.   High blood pressure causes heart disease and increases the risk of stroke. Your blood pressure should be checked at least yearly.  Ongoing high blood pressure should be treated with medicines if weight loss and exercise do not work.   If you are 10-66 years old, ask your health care provider if you should take aspirin to prevent strokes.   Diabetes screening involves taking a blood sample to check your fasting blood sugar level. This should be done once every 3 years, after age 59, if you are within normal weight and without risk factors for diabetes. Testing should be considered at a younger age or be carried out more frequently if you are overweight and have at least 1 risk factor for diabetes.   Breast cancer screening is essential preventive care for women. You should practice "breast self-awareness."  This means understanding the normal appearance and feel of your breasts and may include breast self-examination.  Any changes detected, no matter how small, should be reported to a health care provider.  Women in their 60s and 30s should have a clinical breast exam (CBE) by a health care provider as part of a regular health exam every 1 to 3 years.  After age 63, women should have a CBE every year.  Starting at age 51, women should consider having a mammogram (breast X-ray test) every year.  Women who have a family history of breast cancer should talk to their health care provider about genetic screening.  Women at a high risk of breast cancer should talk to their health care providers about having an MRI and a mammogram every year.   -Breast cancer gene (BRCA)-related cancer risk assessment is recommended for women who have family members with BRCA-related cancers. BRCA-related cancers include breast, ovarian, tubal, and peritoneal cancers. Having family members with these cancers may be associated with an increased risk for harmful changes (mutations) in the breast cancer genes BRCA1 and  BRCA2. Results of the assessment will determine the need for genetic counseling and BRCA1 and BRCA2 testing.   The Pap test is a screening test for cervical cancer. A Pap test can show cell changes on the cervix that might become cervical cancer if left untreated. A Pap test is a procedure in which cells are obtained and examined from the lower end of the uterus (  cervix).   - Women should have a Pap test starting at age 33.   - Between ages 27 and 45, Pap tests should be repeated every 2 years.   - Beginning at age 37, you should have a Pap test every 3 years as long as the past 3 Pap tests have been normal.   - Some women have medical problems that increase the chance of getting cervical cancer. Talk to your health care provider about these problems. It is especially important to talk to your health care provider if a new problem develops soon after your last Pap test. In these cases, your health care provider may recommend more frequent screening and Pap tests.   - The above recommendations are the same for women who have or have not gotten the vaccine for human papillomavirus (HPV).   - If you had a hysterectomy for a problem that was not cancer or a condition that could lead to cancer, then you no longer need Pap tests. Even if you no longer need a Pap test, a regular exam is a good idea to make sure no other problems are starting.   - If you are between ages 2 and 40 years, and you have had normal Pap tests going back 10 years, you no longer need Pap tests. Even if you no longer need a Pap test, a regular exam is a good idea to make sure no other problems are starting.   - If you have had past treatment for cervical cancer or a condition that could lead to cancer, you need Pap tests and screening for cancer for at least 20 years after your treatment.   - If Pap tests have been discontinued, risk factors (such as a new sexual partner) need to be reassessed to determine if screening should  be resumed.   - The HPV test is an additional test that may be used for cervical cancer screening. The HPV test looks for the virus that can cause the cell changes on the cervix. The cells collected during the Pap test can be tested for HPV. The HPV test could be used to screen women aged 80 years and older, and should be used in women of any age who have unclear Pap test results. After the age of 62, women should have HPV testing at the same frequency as a Pap test.   Colorectal cancer can be detected and often prevented. Most routine colorectal cancer screening begins at the age of 83 years and continues through age 51 years. However, your health care provider may recommend screening at an earlier age if you have risk factors for colon cancer. On a yearly basis, your health care provider may provide home test kits to check for hidden blood in the stool.  Use of a small camera at the end of a tube, to directly examine the colon (sigmoidoscopy or colonoscopy), can detect the earliest forms of colorectal cancer. Talk to your health care provider about this at age 16, when routine screening begins. Direct exam of the colon should be repeated every 5 -10 years through age 47 years, unless early forms of pre-cancerous polyps or small growths are found.   People who are at an increased risk for hepatitis B should be screened for this virus. You are considered at high risk for hepatitis B if:  -You were born in a country where hepatitis B occurs often. Talk with your health care provider about which countries are considered high risk.  -  Your parents were born in a high-risk country and you have not received a shot to protect against hepatitis B (hepatitis B vaccine).  - You have HIV or AIDS.  - You use needles to inject street drugs.  - You live with, or have sex with, someone who has Hepatitis B.  - You get hemodialysis treatment.  - You take certain medicines for conditions like cancer, organ  transplantation, and autoimmune conditions.   Hepatitis C blood testing is recommended for all people born from [birth year] through 1965 and any individual with known risks for hepatitis C.   Practice safe sex. Use condoms and avoid high-risk sexual practices to reduce the spread of sexually transmitted infections (STIs). STIs include gonorrhea, chlamydia, syphilis, trichomonas, herpes, HPV, and human immunodeficiency virus (HIV). Herpes, HIV, and HPV are viral illnesses that have no cure. They can result in disability, cancer, and death. Sexually active women aged 20 years and younger should be checked for chlamydia. Older women with new or multiple partners should also be tested for chlamydia. Testing for other STIs is recommended if you are sexually active and at increased risk.   Osteoporosis is a disease in which the bones lose minerals and strength with aging. This can result in serious bone fractures or breaks. The risk of osteoporosis can be identified using a bone density scan. Women ages 85 years and over and women at risk for fractures or osteoporosis should discuss screening with their health care providers. Ask your health care provider whether you should take a calcium supplement or vitamin D to reduce the rate of osteoporosis.   Menopause can be associated with physical symptoms and risks. Hormone replacement therapy is available to decrease symptoms and risks. You should talk to your health care provider about whether hormone replacement therapy is right for you.   Use sunscreen. Apply sunscreen liberally and repeatedly throughout the day. You should seek shade when your shadow is shorter than you. Protect yourself by wearing long sleeves, pants, a wide-brimmed hat, and sunglasses year round, whenever you are outdoors.   Once a month, do a whole body skin exam, using a mirror to look at the skin on your back. Tell your health care provider of new moles, moles that have irregular borders,  moles that are larger than a pencil eraser, or moles that have changed in shape or color.   Stay current with required vaccines (immunizations).   Influenza vaccine. All adults should be immunized every year.   Tetanus, diphtheria, and acellular pertussis (Td, Tdap) vaccine. Pregnant women should receive 1 dose of Tdap vaccine during each pregnancy. The dose should be obtained regardless of the length of time since the last dose. Immunization is preferred during the 27th 36th week of gestation. An adult who has not previously received Tdap or who does not know her vaccine status should receive 1 dose of Tdap. This initial dose should be followed by tetanus and diphtheria toxoids (Td) booster doses every 10 years. Adults with an unknown or incomplete history of completing a 3-dose immunization series with Td-containing vaccines should begin or complete a primary immunization series including a Tdap dose. Adults should receive a Td booster every 10 years.   Varicella vaccine. An adult without evidence of immunity to varicella should receive 2 doses or a second dose if she has previously received 1 dose. Pregnant females who do not have evidence of immunity should receive the first dose after pregnancy. This first dose should be obtained before leaving the  health care facility. The second dose should be obtained 4 8 weeks after the first dose.   Human papillomavirus (HPV) vaccine. Females aged 65 26 years who have not received the vaccine previously should obtain the 3-dose series. The vaccine is not recommended for use in pregnant females. However, pregnancy testing is not needed before receiving a dose. If a female is found to be pregnant after receiving a dose, no treatment is needed. In that case, the remaining doses should be delayed until after the pregnancy. Immunization is recommended for any person with an immunocompromised condition through the age of 49 years if she did not get any or all  doses earlier. During the 3-dose series, the second dose should be obtained 4 8 weeks after the first dose. The third dose should be obtained 24 weeks after the first dose and 16 weeks after the second dose.   Zoster vaccine. One dose is recommended for adults aged 12 years or older unless certain conditions are present.   Measles, mumps, and rubella (MMR) vaccine. Adults born before 25 generally are considered immune to measles and mumps. Adults born in [birth year] or later should have 1 or more doses of MMR vaccine unless there is a contraindication to the vaccine or there is laboratory evidence of immunity to each of the three diseases. A routine second dose of MMR vaccine should be obtained at least 28 days after the first dose for students attending postsecondary schools, health care workers, or international travelers. People who received inactivated measles vaccine or an unknown type of measles vaccine during 1963 1967 should receive 2 doses of MMR vaccine. People who received inactivated mumps vaccine or an unknown type of mumps vaccine before 1979 and are at high risk for mumps infection should consider immunization with 2 doses of MMR vaccine. For females of childbearing age, rubella immunity should be determined. If there is no evidence of immunity, females who are not pregnant should be vaccinated. If there is no evidence of immunity, females who are pregnant should delay immunization until after pregnancy. Unvaccinated health care workers born before [birth year] who lack laboratory evidence of measles, mumps, or rubella immunity or laboratory confirmation of disease should consider measles and mumps immunization with 2 doses of MMR vaccine or rubella immunization with 1 dose of MMR vaccine.   Pneumococcal 13-valent conjugate (PCV13) vaccine. When indicated, a person who is uncertain of her immunization history and has no record of immunization should receive the PCV13 vaccine. An adult aged 79 years or  older who has certain medical conditions and has not been previously immunized should receive 1 dose of PCV13 vaccine. This PCV13 should be followed with a dose of pneumococcal polysaccharide (PPSV23) vaccine. The PPSV23 vaccine dose should be obtained at least 8 weeks after the dose of PCV13 vaccine. An adult aged 29 years or older who has certain medical conditions and previously received 1 or more doses of PPSV23 vaccine should receive 1 dose of PCV13. The PCV13 vaccine dose should be obtained 1 or more years after the last PPSV23 vaccine dose.   Pneumococcal polysaccharide (PPSV23) vaccine. When PCV13 is also indicated, PCV13 should be obtained first. All adults aged 38 years and older should be immunized. An adult younger than age 68 years who has certain medical conditions should be immunized. Any person who resides in a nursing home or long-term care facility should be immunized. An adult smoker should be immunized. People with an immunocompromised condition and certain other conditions should receive both  PCV13 and PPSV23 vaccines. People with human immunodeficiency virus (HIV) infection should be immunized as soon as possible after diagnosis. Immunization during chemotherapy or radiation therapy should be avoided. Routine use of PPSV23 vaccine is not recommended for American Indians, [place] Natives, or people younger than 65 years unless there are medical conditions that require PPSV23 vaccine. When indicated, people who have unknown immunization and have no record of immunization should receive PPSV23 vaccine. One-time revaccination 5 years after the first dose of PPSV23 is recommended for people aged 82 64 years who have chronic kidney failure, nephrotic syndrome, asplenia, or immunocompromised conditions. People who received 1 2 doses of PPSV23 before age 9 years should receive another dose of PPSV23 vaccine at age 68 years or later if at least 5 years have passed since the previous dose. Doses of  PPSV23 are not needed for people immunized with PPSV23 at or after age 25 years.   Meningococcal vaccine. Adults with asplenia or persistent complement component deficiencies should receive 2 doses of quadrivalent meningococcal conjugate (MenACWY-D) vaccine. The doses should be obtained at least 2 months apart. Microbiologists working with certain meningococcal bacteria, [place] recruits, people at risk during an outbreak, and people who travel to or live in countries with a high rate of meningitis should be immunized. A first-year college student up through age 20 years who is living in a residence hall should receive a dose if she did not receive a dose on or after her 16th birthday. Adults who have certain high-risk conditions should receive one or more doses of vaccine.   Hepatitis A vaccine. Adults who wish to be protected from this disease, have certain high-risk conditions, work with hepatitis A-infected animals, work in hepatitis A research labs, or travel to or work in countries with a high rate of hepatitis A should be immunized. Adults who were previously unvaccinated and who anticipate close contact with an international adoptee during the first 60 days after arrival in the [place] from a country with a high rate of hepatitis A should be immunized.   Hepatitis B vaccine. Adults who wish to be protected from this disease, have certain high-risk conditions, may be exposed to blood or other infectious body fluids, are household contacts or sex partners of hepatitis B positive people, are clients or workers in certain care facilities, or travel to or work in countries with a high rate of hepatitis B should be immunized.   Haemophilus influenzae type b (Hib) vaccine. A previously unvaccinated person with asplenia or sickle cell disease or having a scheduled splenectomy should receive 1 dose of Hib vaccine. Regardless of previous immunization, a recipient of a hematopoietic stem cell  transplant should receive a 3-dose series 6 12 months after her successful transplant. Hib vaccine is not recommended for adults with HIV infection.  Preventive Services / Frequency Ages 54 to 39years  Blood pressure check.** / Every 1 to 2 years.  Lipid and cholesterol check.** / Every 5 years beginning at age 87.  Clinical breast exam.** / Every 3 years for women in their 68s and 36s.  BRCA-related cancer risk assessment.** / For women who have family members with a BRCA-related cancer (breast, ovarian, tubal, or peritoneal cancers).  Pap test.** / Every 2 years from ages 61 through 77. Every 3 years starting at age 31 through age 23 or 32 with a history of 3 consecutive normal Pap tests.  HPV screening.** / Every 3 years from ages 83 through ages 52 to 15 with a  history of 3 consecutive normal Pap tests.  Hepatitis C blood test.** / For any individual with known risks for hepatitis C.  Skin self-exam. / Monthly.  Influenza vaccine. / Every year.  Tetanus, diphtheria, and acellular pertussis (Tdap, Td) vaccine.** / Consult your health care provider. Pregnant women should receive 1 dose of Tdap vaccine during each pregnancy. 1 dose of Td every 10 years.  Varicella vaccine.** / Consult your health care provider. Pregnant females who do not have evidence of immunity should receive the first dose after pregnancy.  HPV vaccine. / 3 doses over 6 months, if 28 and younger. The vaccine is not recommended for use in pregnant females. However, pregnancy testing is not needed before receiving a dose.  Measles, mumps, rubella (MMR) vaccine.** / You need at least 1 dose of MMR if you were born in 1957 or later. You may also need a 2nd dose. For females of childbearing age, rubella immunity should be determined. If there is no evidence of immunity, females who are not pregnant should be vaccinated. If there is no evidence of immunity, females who are pregnant should delay immunization until after  pregnancy.  Pneumococcal 13-valent conjugate (PCV13) vaccine.** / Consult your health care provider.  Pneumococcal polysaccharide (PPSV23) vaccine.** / 1 to 2 doses if you smoke cigarettes or if you have certain conditions.  Meningococcal vaccine.** / 1 dose if you are age 85 to 58 years and a Market researcher living in a residence hall, or have one of several medical conditions, you need to get vaccinated against meningococcal disease. You may also need additional booster doses.  Hepatitis A vaccine.** / Consult your health care provider.  Hepatitis B vaccine.** / Consult your health care provider.  Haemophilus influenzae type b (Hib) vaccine.** / Consult your health care provider.  Ages 52 to 64years  Blood pressure check.** / Every 1 to 2 years.  Lipid and cholesterol check.** / Every 5 years beginning at age 58 years.  Lung cancer screening. / Every year if you are aged 83 80 years and have a 30-pack-year history of smoking and currently smoke or have quit within the past 15 years. Yearly screening is stopped once you have quit smoking for at least 15 years or develop a health problem that would prevent you from having lung cancer treatment.  Clinical breast exam.** / Every year after age 60 years.  BRCA-related cancer risk assessment.** / For women who have family members with a BRCA-related cancer (breast, ovarian, tubal, or peritoneal cancers).  Mammogram.** / Every year beginning at age 105 years and continuing for as long as you are in good health. Consult with your health care provider.  Pap test.** / Every 3 years starting at age 38 years through age 70 or 76 years with a history of 3 consecutive normal Pap tests.  HPV screening.** / Every 3 years from ages 6 years through ages 2 to 36 years with a history of 3 consecutive normal Pap tests.  Fecal occult blood test (FOBT) of stool. / Every year beginning at age 41 years and continuing until age 58 years. You may  not need to do this test if you get a colonoscopy every 10 years.  Flexible sigmoidoscopy or colonoscopy.** / Every 5 years for a flexible sigmoidoscopy or every 10 years for a colonoscopy beginning at age 36 years and continuing until age 44 years.  Hepatitis C blood test.** / For all people born from [birth year] through 1964 and any individual with known  risks for hepatitis C.  Skin self-exam. / Monthly.  Influenza vaccine. / Every year.  Tetanus, diphtheria, and acellular pertussis (Tdap/Td) vaccine.** / Consult your health care provider. Pregnant women should receive 1 dose of Tdap vaccine during each pregnancy. 1 dose of Td every 10 years.  Varicella vaccine.** / Consult your health care provider. Pregnant females who do not have evidence of immunity should receive the first dose after pregnancy.  Zoster vaccine.** / 1 dose for adults aged 32 years or older.  Measles, mumps, rubella (MMR) vaccine.** / You need at least 1 dose of MMR if you were born in 1957 or later. You may also need a 2nd dose. For females of childbearing age, rubella immunity should be determined. If there is no evidence of immunity, females who are not pregnant should be vaccinated. If there is no evidence of immunity, females who are pregnant should delay immunization until after pregnancy.  Pneumococcal 13-valent conjugate (PCV13) vaccine.** / Consult your health care provider.  Pneumococcal polysaccharide (PPSV23) vaccine.** / 1 to 2 doses if you smoke cigarettes or if you have certain conditions.  Meningococcal vaccine.** / Consult your health care provider.  Hepatitis A vaccine.** / Consult your health care provider.  Hepatitis B vaccine.** / Consult your health care provider.  Haemophilus influenzae type b (Hib) vaccine.** / Consult your health care provider.  Ages 48 years and over  Blood pressure check.** / Every 1 to 2 years.  Lipid and cholesterol check.** / Every 5 years beginning at age 32  years.  Lung cancer screening. / Every year if you are aged 43 80 years and have a 30-pack-year history of smoking and currently smoke or have quit within the past 15 years. Yearly screening is stopped once you have quit smoking for at least 15 years or develop a health problem that would prevent you from having lung cancer treatment.  Clinical breast exam.** / Every year after age 1 years.  BRCA-related cancer risk assessment.** / For women who have family members with a BRCA-related cancer (breast, ovarian, tubal, or peritoneal cancers).  Mammogram.** / Every year beginning at age 46 years and continuing for as long as you are in good health. Consult with your health care provider.  Pap test.** / Every 3 years starting at age 26 years through age 20 or 58 years with 3 consecutive normal Pap tests. Testing can be stopped between 65 and 70 years with 3 consecutive normal Pap tests and no abnormal Pap or HPV tests in the past 10 years.  HPV screening.** / Every 3 years from ages 59 years through ages 57 or 75 years with a history of 3 consecutive normal Pap tests. Testing can be stopped between 65 and 70 years with 3 consecutive normal Pap tests and no abnormal Pap or HPV tests in the past 10 years.  Fecal occult blood test (FOBT) of stool. / Every year beginning at age 47 years and continuing until age 25 years. You may not need to do this test if you get a colonoscopy every 10 years.  Flexible sigmoidoscopy or colonoscopy.** / Every 5 years for a flexible sigmoidoscopy or every 10 years for a colonoscopy beginning at age 57 years and continuing until age 20 years.  Hepatitis C blood test.** / For all people born from [birth year] through 1964 and any individual with known risks for hepatitis C.  Osteoporosis screening.** / A one-time screening for women ages 69 years and over and women at risk for fractures or  osteoporosis.  Skin self-exam. / Monthly.  Influenza vaccine. / Every year.  Tetanus,  diphtheria, and acellular pertussis (Tdap/Td) vaccine.** / 1 dose of Td every 10 years.  Varicella vaccine.** / Consult your health care provider.  Zoster vaccine.** / 1 dose for adults aged 41 years or older.  Pneumococcal 13-valent conjugate (PCV13) vaccine.** / Consult your health care provider.  Pneumococcal polysaccharide (PPSV23) vaccine.** / 1 dose for all adults aged 6 years and older.  Meningococcal vaccine.** / Consult your health care provider.  Hepatitis A vaccine.** / Consult your health care provider.  Hepatitis B vaccine.** / Consult your health care provider.  Haemophilus influenzae type b (Hib) vaccine.** / Consult your health care provider.     ** Family history and personal history of risk and conditions may change your health care provider's recommendations. Document Released: 09/02/2001 Document Revised: 04/27/2013  [facility] Patient Information 2014 [place], [place].   EXERCISE AND DIET:  We recommended that you start or continue a regular exercise program for good health. Regular exercise means any activity that makes your heart beat faster and makes you sweat.  We recommend exercising at least 30 minutes per day at least 3 days a week, preferably 5.  We also recommend a diet low in fat and sugar / carbohydrates.  Inactivity, poor dietary choices and obesity can cause diabetes, heart attack, stroke, and kidney damage, among others.     ALCOHOL AND SMOKING:  Women should limit their alcohol intake to no more than 7 drinks/beers/glasses of wine (combined, not each!) per week. Moderation of alcohol intake to this level decreases your risk of breast cancer and liver damage.  ( And of course, no recreational drugs are part of a healthy lifestyle.)  Also, you should not be smoking at all or even being exposed to second hand smoke. Most people know smoking can cause cancer, and various heart and lung diseases, but did you know it also contributes to weakening of your  bones?  Aging of your skin?  Yellowing of your teeth and nails?   CALCIUM AND VITAMIN D:  Adequate intake of calcium and Vitamin D are recommended.  The recommendations for exact amounts of these supplements seem to change often, but generally speaking 600 mg of calcium (either carbonate or citrate) and 800 units of Vitamin D per day seems prudent. Certain women may benefit from higher intake of Vitamin D.  If you are among these women, your doctor will have told you during your visit.     PAP SMEARS:  Pap smears, to check for cervical cancer or precancers,  have traditionally been done yearly, although recent scientific advances have shown that most women can have pap smears less often.  However, every woman still should have a physical exam from her gynecologist or primary care physician every year. It will include a breast check, inspection of the vulva and vagina to check for abnormal growths or skin changes, a visual exam of the cervix, and then an exam to evaluate the size and shape of the uterus and ovaries.  And after 59 years of age, a rectal exam is indicated to check for rectal cancers. We will also provide age appropriate advice regarding health maintenance, like when you should have certain vaccines, screening for sexually transmitted diseases, bone density testing, colonoscopy, mammograms, etc.    MAMMOGRAMS:  All women over 64 years old should have a yearly mammogram. Many facilities now offer a "3D" mammogram, which may cost around $50 extra out of  pocket. If possible,  we recommend you accept the option to have the 3D mammogram performed.  It both reduces the number of women who will be called back for extra views which then turn out to be normal, and it is better than the routine mammogram at detecting truly abnormal areas.     COLONOSCOPY:  Colonoscopy to screen for colon cancer is recommended for all women at age 4.  We know, you hate the idea of the prep.  We agree, BUT, having  colon cancer and not knowing it is worse!!  Colon cancer so often starts as a polyp that can be seen and removed at colonscopy, which can quite literally save your life!  And if your first colonoscopy is normal and you have no family history of colon cancer, most women don't have to have it again for 10 years.  Once every ten years, you can do something that may end up saving your life, right?  We will be happy to help you get it scheduled when you are ready.  Be sure to check your insurance coverage so you understand how much it will cost.  It may be covered as a preventative service at no cost, but you should check your particular policy.

## 2016-05-19 NOTE — Assessment & Plan Note (Signed)
Refill trazodone given

## 2016-05-19 NOTE — Assessment & Plan Note (Signed)
Continue meloxicam, tolerating well and works well for generalized arthritis

## 2016-05-19 NOTE — Assessment & Plan Note (Signed)
Referral for colonoscopy °

## 2016-05-19 NOTE — Assessment & Plan Note (Signed)
Continue meloxicam

## 2016-05-19 NOTE — Assessment & Plan Note (Signed)
Discussed detriments to health;  advised weight loss.

## 2016-05-19 NOTE — Assessment & Plan Note (Addendum)
Ice, thumb splints-printed off of Internet and handed to patient. And continue meloxicam. If doesn't do well with ice and thumb splints, we can send her to sports medicine for joint injections under ultrasound guidance in future prn.  See AVS further details of care plan

## 2016-05-27 ENCOUNTER — Telehealth: Payer: Self-pay | Admitting: Family Medicine

## 2016-05-27 NOTE — Telephone Encounter (Signed)
Patient is requesting a refill of her meloxicam and trazodone to be sent to [facility]

## 2016-06-02 MED ORDER — MELOXICAM 15 MG PO TABS: 15.0000 mg | ORAL_TABLET | Freq: Every day | ORAL | 3 refills | Status: DC

## 2016-06-02 NOTE — Telephone Encounter (Signed)
RX for meloxicam sent to [facility].  [facility].  [facility] for #90 with 3 refills.  Pt will need to have this RX transferred to [facility].  Robyn Roberts. Ajmal Kathan, CMA

## 2016-06-02 NOTE — Addendum Note (Signed)
Addended by: Stan HeadNELSON, Talon Witting S on: 06/02/2016 11:43 AM   Modules accepted: Orders

## 2016-06-05 DIAGNOSIS — Z1211 Encounter for screening for malignant neoplasm of colon: Secondary | ICD-10-CM | POA: Diagnosis not present

## 2016-06-05 DIAGNOSIS — K581 Irritable bowel syndrome with constipation: Secondary | ICD-10-CM | POA: Diagnosis not present

## 2016-06-10 ENCOUNTER — Other Ambulatory Visit: Payer: Self-pay

## 2016-06-10 DIAGNOSIS — G4700 Insomnia, unspecified: Secondary | ICD-10-CM

## 2016-06-10 MED ORDER — TRAZODONE HCL 100 MG PO TABS: 100.0000 mg | ORAL_TABLET | Freq: Every evening | ORAL | 2 refills | Status: DC

## 2016-06-10 NOTE — Telephone Encounter (Signed)
Pt called stating that she need her trazodone RX sent to [facility] instead of [facility].  Contacted [facility] and cancelled RX.  New RX sent to [facility].  Tiajuana Amass. Isaac Dubie, CMA

## 2016-06-16 NOTE — Progress Notes (Signed)
ERROR

## 2016-08-17 ENCOUNTER — Other Ambulatory Visit: Payer: Self-pay | Admitting: Family Medicine

## 2016-09-04 ENCOUNTER — Ambulatory Visit (INDEPENDENT_AMBULATORY_CARE_PROVIDER_SITE_OTHER): Payer: BLUE CROSS/BLUE SHIELD | Admitting: Adult Health

## 2016-09-04 ENCOUNTER — Encounter: Payer: Self-pay | Admitting: Adult Health

## 2016-09-04 VITALS — BP 120/72 | HR 66 | Ht 68.25 in | Wt 188.2 lb

## 2016-09-04 DIAGNOSIS — M159 Polyosteoarthritis, unspecified: Secondary | ICD-10-CM

## 2016-09-04 MED ORDER — DULOXETINE HCL 60 MG PO CPEP: 60.0000 mg | ORAL_CAPSULE | Freq: Every day | ORAL | 3 refills | Status: DC

## 2016-09-04 MED ORDER — DICLOFENAC SODIUM 1 % TD GEL: 1.0000 "application " | Freq: Four times a day (QID) | TRANSDERMAL | 2 refills | Status: DC

## 2016-09-04 NOTE — Progress Notes (Signed)
Subjective:    Patient ID: Robyn Roberts, female    DOB: 05-26-1956, 61 y.o.   MRN: [id number]  HPI:  Robyn Roberts presents with chronic hip, bil knee, and bil hand pain.  Pain in all areas can range from 4/10-8/10.  She is right hand dominant, however reports that he left hand "hurts more and is weaker".  She was previously taking daily Meloxicam for "almost a year then when I went to the GI specialist she told me to stop b/c of potential harm to my esophagus and stomach".  Last dose of Melxicam was approx 6 weeks ago.  She reports that Tylenol doesn't work and she will occasionally take "a coupled of Ibuprofen when I am really hurting".  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     Patient Care Team    Relationship Specialty Notifications Start End  Thomasene Lot, DO PCP - General Family Medicine  04/03/16   Lynden Ang, NP Nurse Practitioner Obstetrics and Gynecology  04/03/16     Patient Active Problem List   Diagnosis Date Noted  . Perimenopausal- last mense 2009 05/19/2016  . Osteoarthritis of carpometacarpal [facility]) joint of both thumbs L>er R 05/19/2016  . Arthritis- b/l knees 05/19/2016  . Environmental and seasonal allergies 04/05/2016  . Generalized OA- hands, back, neck, hips, knees 04/05/2016  . Overweight (BMI 25.0-29.9) 04/05/2016  . Idiopathic scoliosis 04/03/2016  . Family history of colon cancer- father age late 71's 04/03/2016  . Chronic back pain 05/03/2015  . Insomnia 12/10/2005  . Difficulty hearing 11/12/2005     Past Medical History:  Diagnosis Date  . Arthritis   . Chronic back pain   . Scoliosis      Past Surgical History:  Procedure Laterality Date  . CESAREAN SECTION  1991  . FOOT SURGERY Bilateral 1985   bunionectomy     Family History  Problem Relation Age of Onset  . Heart disease Mother   . Colon polyps Mother   . Cancer Mother     thyroid  . Asthma Mother   . Cancer  Father     colon  . Diabetes Father   . Depression Father   . CVA Father      History  Drug Use No     History  Alcohol Use  . Yes    Comment: rare     History  Smoking Status  . Never Smoker  Smokeless Tobacco  . Never Used     Outpatient Encounter Prescriptions as of 09/04/2016  Medication Sig Note  . fexofenadine (ALLEGRA ALLERGY) 180 MG tablet Take 1 tablet by mouth daily. 05/03/2015: Received from: [facility] Received Sig: Take by mouth.  . Melatonin 5 MG TABS Take 1 tablet by mouth as needed.   . MULTIPLE VITAMIN PO Take 1 tablet by mouth daily. 05/03/2015: Received from: [facility] Received Sig: Take by mouth.  . traZODone (DESYREL) 100 MG tablet Take 1 tablet (100 mg total) by mouth every evening.   . diclofenac sodium (VOLTAREN) 1 % GEL Apply 1 application topically 4 (four) times daily.   . DULoxetine (CYMBALTA) 60 MG capsule Take 1 capsule (60 mg total) by mouth daily. Please take 1/2 tablet for 7 days,  then increase to full tablet daily.   Marland Kitchen LINZESS 72 MCG capsule Take 1 tablet by mouth daily.   . [DISCONTINUED] meloxicam (MOBIC) 15  MG tablet Take 1 tablet (15 mg total) by mouth daily. (Patient not taking: Reported on 09/04/2016)    No facility-administered encounter medications on file as of 09/04/2016.     Allergies: Aspirin; Penicillins; and Sulfa antibiotics  Body mass index is 28.41 kg/m.  Blood pressure 120/72, pulse 66, height 5' 8.25" (1.734 m), weight 188 lb 3.2 oz (85.4 kg).     Review of Systems  Constitutional: Negative for activity change, appetite change, chills, diaphoresis, fatigue, fever and unexpected weight change.  Respiratory: Negative for cough and shortness of breath.   Cardiovascular: Negative for chest pain, palpitations and leg swelling.  Gastrointestinal: Negative for abdominal distention, abdominal pain, blood in stool, constipation, diarrhea, nausea and vomiting.  Musculoskeletal:  Positive for arthralgias, back pain, joint swelling and myalgias. Negative for gait problem, neck pain and neck stiffness.  Neurological: Negative for dizziness, tremors and weakness.  Psychiatric/Behavioral: Negative for agitation, behavioral problems, self-injury and suicidal ideas.       Objective:   Physical Exam  Constitutional: She is oriented to person, place, and time. She appears well-developed and well-nourished. No distress.  Cardiovascular: Normal rate, regular rhythm, normal heart sounds and intact distal pulses.   Pulmonary/Chest: Effort normal and breath sounds normal. No respiratory distress. She has no wheezes. She has no rales. She exhibits no tenderness.  Musculoskeletal: Normal range of motion. She exhibits tenderness.       Right wrist: She exhibits tenderness. She exhibits normal range of motion and no swelling.       Left wrist: She exhibits tenderness. She exhibits normal range of motion and no swelling.       Right hip: She exhibits tenderness. She exhibits normal range of motion and normal strength.       Left hip: She exhibits tenderness. She exhibits normal range of motion and normal strength.       Right knee: She exhibits bony tenderness. She exhibits normal range of motion and no swelling.       Left knee: She exhibits bony tenderness. She exhibits normal range of motion and no swelling.       Thoracic back: She exhibits tenderness. She exhibits normal range of motion.       Lumbar back: She exhibits tenderness. She exhibits normal range of motion.  Neurological: She is alert and oriented to person, place, and time. She has normal reflexes.  Skin: Skin is warm and dry. No rash noted. She is not diaphoretic. No erythema. No pallor.  Psychiatric: She has a normal mood and affect. Her behavior is normal. Judgment and thought content normal.  Nursing note and vitals reviewed.         Assessment & Plan:   1. Generalized OA- hands, back, neck, hips, knees      Generalized OA- hands, back, neck, hips, knees Started on Cymbalta 30mg  for 7 days, then increase to 60mg  daily for maintenance dose. Use Diclofenac gel as directed. Recommended daily stretching (i.e. YouTube videos-Yoga). Return in 4 weeks for re-evaluation.    FOLLOW-UP:  Return in about 1 month (around 10/02/2016) for Regular Follow Up, Evaluate Medication Effectiveness.

## 2016-09-04 NOTE — Patient Instructions (Addendum)
Arthritis Introduction Arthritis is a term that is commonly used to refer to joint pain or joint disease. There are more than 100 types of arthritis. What are the causes? The most common cause of this condition is wear and tear of a joint. Other causes include:  Gout.  Inflammation of a joint.  An infection of a joint.  Sprains and other injuries near the joint.  A drug reaction or allergic reaction. In some cases, the cause may not be known. What are the signs or symptoms? The main symptom of this condition is pain in the joint with movement. Other symptoms include:  Redness, swelling, or stiffness at a joint.  Warmth coming from the joint.  Fever.  Overall feeling of illness. How is this diagnosed? This condition may be diagnosed with a physical exam and tests, including:  Blood tests.  Urine tests.  Imaging tests, such as MRI, X-rays, or a CT scan. Sometimes, fluid is removed from a joint for testing. How is this treated? Treatment for this condition may involve:  Treatment of the cause, if it is known.  Rest.  Raising (elevating) the joint.  Applying cold or hot packs to the joint.  Medicines to improve symptoms and reduce inflammation.  Injections of a steroid such as cortisone into the joint to help reduce pain and inflammation. Depending on the cause of your arthritis, you may need to make lifestyle changes to reduce stress on your joint. These changes may include exercising more and losing weight. Follow these instructions at home: Medicines  Take over-the-counter and prescription medicines only as told by your health care provider.  Do not take aspirin to relieve pain if gout is suspected. Activity  Rest your joint if told by your health care provider. Rest is important when your disease is active and your joint feels painful, swollen, or stiff.  Avoid activities that make the pain worse. It is important to balance activity with rest.  Exercise  your joint regularly with range-of-motion exercises as told by your health care provider. Try doing low-impact exercise, such as:  Swimming.  Water aerobics.  Biking.  Walking. Joint Care   If your joint is swollen, keep it elevated if told by your health care provider.  If your joint feels stiff in the morning, try taking a warm shower.  If directed, apply heat to the joint. If you have diabetes, do not apply heat without permission from your health care provider.  Put a towel between the joint and the hot pack or heating pad.  Leave the heat on the area for 20-30 minutes.  If directed, apply ice to the joint:  Put ice in a plastic bag.  Place a towel between your skin and the bag.  Leave the ice on for 20 minutes, 2-3 times per day.  Keep all follow-up visits as told by your health care provider. This is important. Contact a health care provider if:  The pain gets worse.  You have a fever. Get help right away if:  You develop severe joint pain, swelling, or redness.  Many joints become painful and swollen.  You develop severe back pain.  You develop severe weakness in your leg.  You cannot control your bladder or bowels. This information is not intended to replace advice given to you by your health care provider. Make sure you discuss any questions you have with your health care provider. Document Released: 08/14/2004 Document Revised: 12/13/2015 Document Reviewed: 10/02/2014  2017 [employer]  Take 1/2  tablet of Cymbalta for 7 days, then increase to 1 tablet daily. Use NSAID gel as directed. Daily stretching (i.e. Yoga). Please return in 4 weeks for re-evaluation.

## 2016-09-04 NOTE — Assessment & Plan Note (Signed)
Started on Cymbalta 30mg  for 7 days, then increase to 60mg  daily for maintenance dose. Use Diclofenac gel as directed. Recommended daily stretching (i.e. YouTube videos-Yoga). Return in 4 weeks for re-evaluation.

## 2016-09-12 NOTE — Progress Notes (Signed)
ERROR

## 2016-09-17 ENCOUNTER — Telehealth: Payer: Self-pay | Admitting: Family Medicine

## 2016-09-17 NOTE — Telephone Encounter (Signed)
Pt called states she is having some side effects frm the Simbalta medication-- needs to know how she should approach being wing off med-- Pls call her back asap. -glh

## 2016-09-17 NOTE — Telephone Encounter (Signed)
Pt called back states medication is making her very Dizzy, Sleepy,and foggy headed she can't think straight and want to stop taking the medicine. ---glh

## 2016-09-18 ENCOUNTER — Other Ambulatory Visit: Payer: Self-pay | Admitting: Adult Health

## 2016-09-18 DIAGNOSIS — G8929 Other chronic pain: Secondary | ICD-10-CM

## 2016-09-18 DIAGNOSIS — R52 Pain, unspecified: Principal | ICD-10-CM

## 2016-09-18 NOTE — Telephone Encounter (Signed)
Pt informed.  Pt expressed understanding and is agreeable.  T. Johnthomas Lader, CMA  

## 2016-09-18 NOTE — Telephone Encounter (Signed)
Please advise.  T. Estefany Goebel, CMA 

## 2016-09-18 NOTE — Telephone Encounter (Signed)
LVM for pt to return call.  T. Shaunte Weissinger, CMA 

## 2016-09-18 NOTE — Telephone Encounter (Incomplete)
Good Afternoon Tonya, Can you please Ms. Antony and tell to please stop the Cymbalta. She was started on 30mg  09/04/16, instructed to take 30mg  for 7 days then increase to mx dose of 60mg . So she should only be on the 60mg  for about 7 days now.  To taper off safely, please take 30mg  tablets for the next 3 days, then every other day until

## 2016-09-18 NOTE — Telephone Encounter (Signed)
Since GI advised her not to use the Meloxicam, then I would recommend referral to Rheumatologist for evaluation. I will put referral in. Thanks! Orpha BurKaty

## 2016-09-22 DIAGNOSIS — Z800 Family history of malignant neoplasm of digestive organs: Secondary | ICD-10-CM | POA: Diagnosis not present

## 2016-09-22 DIAGNOSIS — Z1211 Encounter for screening for malignant neoplasm of colon: Secondary | ICD-10-CM | POA: Diagnosis not present

## 2016-10-02 ENCOUNTER — Ambulatory Visit: Payer: BLUE CROSS/BLUE SHIELD | Admitting: Adult Health

## 2016-11-24 ENCOUNTER — Other Ambulatory Visit: Payer: Self-pay | Admitting: Family Medicine

## 2016-11-24 DIAGNOSIS — Z1231 Encounter for screening mammogram for malignant neoplasm of breast: Secondary | ICD-10-CM

## 2016-12-11 ENCOUNTER — Ambulatory Visit
Admission: RE | Admit: 2016-12-11 | Discharge: 2016-12-11 | Disposition: A | Discharge status: Home / Self Care | Payer: BLUE CROSS/BLUE SHIELD | Source: Ambulatory Visit | Attending: Family Medicine | Admitting: Family Medicine

## 2016-12-11 DIAGNOSIS — Z1231 Encounter for screening mammogram for malignant neoplasm of breast: Secondary | ICD-10-CM

## 2016-12-25 DIAGNOSIS — M2550 Pain in unspecified joint: Secondary | ICD-10-CM | POA: Diagnosis not present

## 2016-12-25 DIAGNOSIS — M150 Primary generalized (osteo)arthritis: Secondary | ICD-10-CM | POA: Diagnosis not present

## 2017-03-09 ENCOUNTER — Other Ambulatory Visit: Payer: Self-pay | Admitting: Family Medicine

## 2017-03-09 DIAGNOSIS — G4700 Insomnia, unspecified: Secondary | ICD-10-CM

## 2017-04-20 ENCOUNTER — Encounter: Payer: Self-pay | Admitting: Family Medicine

## 2017-04-20 ENCOUNTER — Ambulatory Visit (INDEPENDENT_AMBULATORY_CARE_PROVIDER_SITE_OTHER): Payer: BLUE CROSS/BLUE SHIELD | Admitting: Family Medicine

## 2017-04-20 VITALS — BP 100/68 | HR 76 | Ht 68.25 in | Wt 182.7 lb

## 2017-04-20 DIAGNOSIS — M159 Polyosteoarthritis, unspecified: Secondary | ICD-10-CM | POA: Diagnosis not present

## 2017-04-20 DIAGNOSIS — G4700 Insomnia, unspecified: Secondary | ICD-10-CM | POA: Diagnosis not present

## 2017-04-20 DIAGNOSIS — J3089 Other allergic rhinitis: Secondary | ICD-10-CM

## 2017-04-20 DIAGNOSIS — Z23 Encounter for immunization: Secondary | ICD-10-CM | POA: Diagnosis not present

## 2017-04-20 MED ORDER — TRAZODONE HCL 100 MG PO TABS: 150.0000 mg | ORAL_TABLET | Freq: Every evening | ORAL | 3 refills | Status: DC | PRN

## 2017-04-20 NOTE — Patient Instructions (Addendum)
Told patient very important we get labs as we have not had any in over 2 years.  Patient missed her appointment a year ago.  She will make an appointment in the very near future later on this week for fasting labs.  If there are some abnormalities with your labs really would be best if you came back and we discussed them especially since we've never done this together before.  I do go through each one are explained that the importance of the marked etc.  Please realize, EXERCISE IS MEDICINE!  -  American Heart Association [facility]) guidelines for exercise : If you are in good health, without any medical conditions, you should engage in 150 minutes of moderate intensity aerobic activity per week.  This means you should be huffing and puffing throughout your workout.   Engaging in regular exercise will improve brain function and memory, as well as improve mood, boost immune system and help with weight management.  As well as the other, more well-known effects of exercise such as decreasing blood sugar levels, decreasing blood pressure,  and decreasing bad cholesterol levels/ increasing good cholesterol levels.     -  The AHA strongly endorses consumption of a diet that contains a variety of foods from all the food categories with an emphasis on fruits and vegetables; fat-free and low-fat dairy products; cereal and grain products; legumes and nuts; and fish, poultry, and/or extra lean meats.    Excessive food intake, especially of foods high in saturated and trans fats, sugar, and salt, should be avoided.    Adequate water intake of roughly 1/2 of your weight in pounds, should equal the ounces of water per day you should drink.  So for instance, if you're 200 pounds, that would be 100 ounces of water per day.         Mediterranean Diet  Why follow it? Research shows. . Those who follow the Mediterranean diet have a reduced risk of heart disease  . The diet is associated with a reduced incidence of  Parkinson's and Alzheimer's diseases . People following the diet may have longer life expectancies and lower rates of chronic diseases  . The Dietary Guidelines for Americans recommends the Mediterranean diet as an eating plan to promote health and prevent disease  What Is the Mediterranean Diet?  . Healthy eating plan based on typical foods and recipes of Mediterranean-style cooking . The diet is primarily a plant based diet; these foods should make up a majority of meals   Starches - Plant based foods should make up a majority of meals - They are an important sources of vitamins, minerals, energy, antioxidants, and fiber - Choose whole grains, foods high in fiber and minimally processed items  - Typical grain sources include wheat, oats, barley, corn, brown rice, bulgar, farro, millet, polenta, couscous  - Various types of beans include chickpeas, lentils, fava beans, black beans, white beans   Fruits  Veggies - Large quantities of antioxidant rich fruits & veggies; 6 or more servings  - Vegetables can be eaten raw or lightly drizzled with oil and cooked  - Vegetables common to the traditional Mediterranean Diet include: artichokes, arugula, beets, broccoli, brussel sprouts, cabbage, carrots, celery, collard greens, cucumbers, eggplant, kale, leeks, lemons, lettuce, mushrooms, okra, onions, peas, peppers, potatoes, pumpkin, radishes, rutabaga, shallots, spinach, sweet potatoes, turnips, zucchini - Fruits common to the Mediterranean Diet include: apples, apricots, avocados, cherries, clementines, dates, figs, grapefruits, grapes, melons, nectarines, oranges, peaches, pears, pomegranates, strawberries,  tangerines  Fats - Replace butter and margarine with healthy oils, such as olive oil, canola oil, and tahini  - Limit nuts to no more than a handful a day  - Nuts include walnuts, almonds, pecans, pistachios, pine nuts  - Limit or avoid candied, honey roasted or heavily salted nuts - Olives are  central to the Mediterranean diet - can be eaten whole or used in a variety of dishes   Meats Protein - Limiting red meat: no more than a few times a month - When eating red meat: choose lean cuts and keep the portion to the size of deck of cards - Eggs: approx. 0 to 4 times a week  - Fish and lean poultry: at least 2 a week  - Healthy protein sources include, chicken, [place], lean beef, lamb - Increase intake of seafood such as tuna, salmon, trout, mackerel, shrimp, scallops - Avoid or limit high fat processed meats such as sausage and bacon  Dairy - Include moderate amounts of low fat dairy products  - Focus on healthy dairy such as fat free yogurt, skim milk, low or reduced fat cheese - Limit dairy products higher in fat such as whole or 2% milk, cheese, ice cream  Alcohol - Moderate amounts of red wine is ok  - No more than 5 oz daily for women (all ages) and men older than age 15  - No more than 10 oz of wine daily for men younger than 76  Other - Limit sweets and other desserts  - Use herbs and spices instead of salt to flavor foods  - Herbs and spices common to the traditional Mediterranean Diet include: basil, bay leaves, chives, cloves, cumin, fennel, garlic, lavender, marjoram, mint, oregano, parsley, pepper, rosemary, sage, savory, sumac, tarragon, thyme   It's not just a diet, it's a lifestyle:  . The Mediterranean diet includes lifestyle factors typical of those in the region  . Foods, drinks and meals are best eaten with others and savored . Daily physical activity is important for overall good health . This could be strenuous exercise like running and aerobics . This could also be more leisurely activities such as walking, housework, yard-work, or taking the stairs . Moderation is the key; a balanced and healthy diet accommodates most foods and drinks . Consider portion sizes and frequency of consumption of certain foods   Meal Ideas & Options:  . Breakfast:  o Whole wheat  toast or whole wheat English muffins with peanut butter & hard boiled egg o Steel cut oats topped with apples & cinnamon and skim milk  o Fresh fruit: banana, strawberries, melon, berries, peaches  o Smoothies: strawberries, bananas, greek yogurt, peanut butter o Low fat greek yogurt with blueberries and granola  o Egg white omelet with spinach and mushrooms o Breakfast couscous: whole wheat couscous, apricots, skim milk, cranberries  . Sandwiches:  o Hummus and grilled vegetables (peppers, zucchini, squash) on whole wheat bread   o Grilled chicken on whole wheat pita with lettuce, tomatoes, cucumbers or tzatziki  o Tuna salad on whole wheat bread: tuna salad made with greek yogurt, olives, red peppers, capers, green onions o Garlic rosemary lamb pita: lamb sauted with garlic, rosemary, salt & pepper; add lettuce, cucumber, greek yogurt to pita - flavor with lemon juice and black pepper  . Seafood:  o Mediterranean grilled salmon, seasoned with garlic, basil, parsley, lemon juice and black pepper o Shrimp, lemon, and spinach whole-grain pasta salad made with low fat  greek yogurt  o Seared scallops with lemon orzo  o Seared tuna steaks seasoned salt, pepper, coriander topped with tomato mixture of olives, tomatoes, olive oil, minced garlic, parsley, green onions and cappers  . Meats:  o Herbed greek chicken salad with kalamata olives, cucumber, feta  o Red bell peppers stuffed with spinach, bulgur, lean ground beef (or lentils) & topped with feta   o Kebabs: skewers of chicken, tomatoes, onions, zucchini, squash  o [place] burgers: made with red onions, mint, dill, lemon juice, feta cheese topped with roasted red peppers . Vegetarian o Cucumber salad: cucumbers, artichoke hearts, celery, red onion, feta cheese, tossed in olive oil & lemon juice  o Hummus and whole grain pita points with a greek salad (lettuce, tomato, feta, olives, cucumbers, red onion) o Lentil soup with celery, carrots made  with vegetable broth, garlic, salt and pepper  o Tabouli salad: parsley, bulgur, mint, scallions, cucumbers, tomato, radishes, lemon juice, olive oil, salt and pepper.

## 2017-04-20 NOTE — Progress Notes (Signed)
Impression and Recommendations:    1. Generalized OA- hands, back, neck, hips, knees   2. Insomnia, unspecified type   3. Environmental and seasonal allergies   4. Flu vaccine need    Generalized OA- hands, back, neck, hips, knees - back on the Mobic was fine.  She also had rule out for rheumatoid and some other labs that were done which were all negative.  She had negative CRP, rheumatoid factor, sedimentation rate etc.  Insomnia, unspecified type - Plan: traZODone (DESYREL) 100 MG tablet - Patient is taking 150 mg trazodone nightly.  Tolerating well.   Environmental and seasonal allergies -cont meds as prescribed  Flu vaccine need - Plan: Flu Vaccine QUAD 6+ mos PF IM (Fluarix Quad PF)  The patient was counseled, risk factors were discussed, anticipatory guidance given.   Meds ordered this encounter  Medications  . traZODone (DESYREL) 100 MG tablet    Sig: Take 1.5 tablets (150 mg total) by mouth at bedtime as needed for sleep. PATIENT MUST HAVE OFFICE VISIT PRIOR TO ANY FURTHER REFILLS    Dispense:  135 tablet    Refill:  3     Orders Placed This Encounter  Procedures  . Flu Vaccine QUAD 6+ mos PF IM (Fluarix Quad PF)    Gross side effects, risk and benefits, and alternatives of medications and treatment plan in general discussed with patient.  Patient is aware that all medications have potential side effects and we are unable to predict every side effect or drug-drug interaction that may occur.   Patient will call with any questions prior to using medication if they have concerns.  Expresses verbal understanding and consents to current therapy and treatment regimen.  No barriers to understanding were identified.  Red flag symptoms and signs discussed in detail.  Patient expressed understanding regarding what to do in case of emergency\urgent symptoms  Please see AVS handed out to patient at the end of our visit for further patient instructions/ counseling done  pertaining to today's office visit.   Return in about 6 months (around 10/19/2017).     Note: This document was prepared using Dragon voice recognition software and may include unintentional dictation errors.  Thomasene Lot 2:42 PM --------------------------------------------------------------------------------------------------------------------------------------------------------------------------------------------------------------------------------------------    Subjective:    CC:  Chief Complaint  Patient presents with  . Follow-up    HPI: Robyn Roberts is a 61 y.o. female who presents to [facility] today for issues as discussed below.  Patient is taking 150 mg trazodone nightly.  Tolerating well.  Wakes up feeling rested.  Sleeps through the night most nights.  melatoin additionally  Allegra daily--.  Allergies haven't been bad but okay controlled.  She occasionally uses Flonase.  Occasionally she does Nettie pot as well which helps tremendously.  She also did go see rheumatology Dr. Zenovia Jordan recently for her joint pain in her hands especially her thumbs.  He was evaluated and thought that going back on the Mobic was fine.  She also had rule out for rheumatoid and some other labs that were done which were all negative.  She had negative CRP, rheumatoid factor, sedimentation rate etc.  Also recently saw [place] Pink for colonoscopy in March 2018.  We do not have those medical records.  I am asking the CMA to obtain those today.  Was completely normal told to follow-up in 10 years   No problems updated.   Wt Readings from Last 3  Encounters:  04/20/17 182 lb 11.2 oz (82.9 kg)  09/04/16 188 lb 3.2 oz (85.4 kg)  05/19/16 181 lb 6.4 oz (82.3 kg)   BP Readings from Last 3 Encounters:  04/23/17 110/71  04/20/17 100/68  09/04/16 120/72   Pulse Readings from Last 3 Encounters:  04/23/17 61  04/20/17 76  09/04/16 66   BMI  Readings from Last 3 Encounters:  04/20/17 27.58 kg/m  09/04/16 28.41 kg/m  05/19/16 27.38 kg/m     Patient Care Team    Relationship Specialty Notifications Start End  Thomasene Lot, DO PCP - General Family Medicine  04/03/16   Lynden Ang, NP Nurse Practitioner Obstetrics and Gynecology  04/03/16   Zenovia Jordan, MD Consulting Physician Rheumatology  04/20/17   Charna Elizabeth, MD Consulting Physician Gastroenterology  04/20/17      Patient Active Problem List   Diagnosis Date Noted  . Perimenopausal- last mense 2009 05/19/2016  . 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 Arthritis- b/l knees 05/19/2016  . Environmental and seasonal allergies 04/05/2016  . Generalized OA- hands, back, neck, hips, knees 04/05/2016  . Overweight (BMI 25.0-29.9) 04/05/2016  . Idiopathic scoliosis 04/03/2016  . Family history of colon cancer- father age late 26's 04/03/2016  . Chronic back pain 05/03/2015  . Insomnia 12/10/2005  . Difficulty hearing 11/12/2005    Past Medical history, Surgical history, Family history, Social history, Allergies and Medications have been entered into the medical record, reviewed and changed as needed.    Current Meds  Medication Sig  . fexofenadine (ALLEGRA ALLERGY) 180 MG tablet Take 1 tablet by mouth daily.  Marland Kitchen LINZESS 72 MCG capsule Take 1 tablet by mouth daily.  . Melatonin 5 MG TABS Take 1 tablet by mouth as needed.  . meloxicam (MOBIC) 15 MG tablet Take 15 mg by mouth daily.  . MULTIPLE VITAMIN PO Take 1 tablet by mouth daily.  . traZODone (DESYREL) 100 MG tablet Take 1.5 tablets (150 mg total) by mouth at bedtime as needed for sleep. PATIENT MUST HAVE OFFICE VISIT PRIOR TO ANY FURTHER REFILLS  . [DISCONTINUED] traZODone (DESYREL) 100 MG tablet Take 1 tablet (100 mg total) by mouth every evening. PATIENT MUST HAVE OFFICE VISIT PRIOR TO ANY FURTHER REFILLS    Allergies:  Allergies  Allergen Reactions  .  Aspirin Swelling  . Penicillins Swelling  . Sulfa Antibiotics Hives     Review of Systems: General:   Denies fever, chills, unexplained weight loss.  Optho/Auditory:   Denies visual changes, blurred vision/LOV Respiratory:   Denies wheeze, DOE more than baseline levels.  Cardiovascular:   Denies chest pain, palpitations, new onset peripheral edema  Gastrointestinal:   Denies nausea, vomiting, diarrhea, abd pain.  Genitourinary: Denies dysuria, freq/ urgency, flank pain or discharge from genitals.  Endocrine:     Denies hot or cold intolerance, polyuria, polydipsia. Musculoskeletal:   Denies unexplained myalgias, joint swelling, unexplained arthralgias, gait problems.  Skin:  Denies new onset rash, suspicious lesions Neurological:     Denies dizziness, unexplained weakness, numbness  Psychiatric/Behavioral:   Denies mood changes, suicidal or homicidal ideations, hallucinations    Objective:   Blood pressure 100/68, pulse 76, height 5' 8.25" (1.734 m), weight 182 lb 11.2 oz (82.9 kg). Body mass index is 27.58 kg/m. General:  Well Developed, well nourished, appropriate for stated age.  Neuro:  Alert and oriented,  extra-ocular muscles intact  HEENT:  Normocephalic, atraumatic, neck supple, no carotid bruits appreciated  Skin:  no gross rash, warm, pink. Cardiac:  RRR, S1 S2 Respiratory:  ECTA B/L and A/P, Not using accessory muscles, speaking in full sentences- unlabored. Vascular:  Ext warm, no cyanosis apprec.; cap RF less 2 sec. Psych:  No HI/SI, judgement and insight good, Euthymic mood. Full Affect.

## 2017-04-23 ENCOUNTER — Other Ambulatory Visit (INDEPENDENT_AMBULATORY_CARE_PROVIDER_SITE_OTHER): Payer: BLUE CROSS/BLUE SHIELD

## 2017-04-23 VITALS — BP 110/71 | HR 61 | Temp 97.6°F

## 2017-04-23 DIAGNOSIS — Z23 Encounter for immunization: Secondary | ICD-10-CM | POA: Diagnosis not present

## 2017-04-23 DIAGNOSIS — Z0000 Encounter for general adult medical examination without abnormal findings: Secondary | ICD-10-CM

## 2017-04-23 NOTE — Progress Notes (Signed)
Pt here for influenza vaccine.  Screening questionnaire reviewed, VIS provided to patient, and any/all patient questions answered.  T. Farron Watrous, CMA  

## 2017-04-24 LAB — CBC WITH DIFFERENTIAL/PLATELET
Basophils Absolute: 0 10*3/uL (ref 0.0–0.2)
Basos: 0 %
EOS (ABSOLUTE): 0.3 10*3/uL (ref 0.0–0.4)
Eos: 3 %
Hematocrit: 42.4 % (ref 34.0–46.6)
Hemoglobin: 14.3 g/dL (ref 11.1–15.9)
Immature Grans (Abs): 0 10*3/uL (ref 0.0–0.1)
Immature Granulocytes: 0 %
Lymphocytes Absolute: 2 10*3/uL (ref 0.7–3.1)
Lymphs: 22 %
MCH: 30.6 pg (ref 26.6–33.0)
MCHC: 33.7 g/dL (ref 31.5–35.7)
MCV: 91 fL (ref 79–97)
Monocytes Absolute: 0.7 10*3/uL (ref 0.1–0.9)
Monocytes: 7 %
Neutrophils Absolute: 6.2 10*3/uL (ref 1.4–7.0)
Neutrophils: 68 %
Platelets: 259 10*3/uL (ref 150–379)
RBC: 4.68 x10E6/uL (ref 3.77–5.28)
RDW: 12.7 % (ref 12.3–15.4)
WBC: 9.2 10*3/uL (ref 3.4–10.8)

## 2017-04-24 LAB — COMPREHENSIVE METABOLIC PANEL
ALT: 23 IU/L (ref 0–32)
AST: 17 IU/L (ref 0–40)
Albumin/Globulin Ratio: 2 (ref 1.2–2.2)
Albumin: 4.4 g/dL (ref 3.5–5.5)
Alkaline Phosphatase: 99 IU/L (ref 39–117)
BUN/Creatinine Ratio: 24 — ABNORMAL HIGH (ref 9–23)
BUN: 20 mg/dL (ref 6–24)
Bilirubin Total: 0.5 mg/dL (ref 0.0–1.2)
CO2: 23 mmol/L (ref 20–29)
Calcium: 9.4 mg/dL (ref 8.7–10.2)
Chloride: 104 mmol/L (ref 96–106)
Creatinine, Ser: 0.85 mg/dL (ref 0.57–1.00)
GFR calc Af Amer: 87 mL/min/{1.73_m2} (ref >59)
GFR calc non Af Amer: 75 mL/min/{1.73_m2} (ref >59)
Globulin, Total: 2.2 g/dL (ref 1.5–4.5)
Glucose: 94 mg/dL (ref 65–99)
Potassium: 4.4 mmol/L (ref 3.5–5.2)
Sodium: 143 mmol/L (ref 134–144)
Total Protein: 6.6 g/dL (ref 6.0–8.5)

## 2017-04-24 LAB — LIPID PANEL
Chol/HDL Ratio: 2.5 ratio (ref 0.0–4.4)
Cholesterol, Total: 128 mg/dL (ref 100–199)
HDL: 52 mg/dL (ref >39)
LDL Calculated: 54 mg/dL (ref 0–99)
Triglycerides: 111 mg/dL (ref 0–149)
VLDL Cholesterol Cal: 22 mg/dL (ref 5–40)

## 2017-04-24 LAB — VITAMIN D 25 HYDROXY (VIT D DEFICIENCY, FRACTURES): Vit D, 25-Hydroxy: 25.1 ng/mL — ABNORMAL LOW (ref 30.0–100.0)

## 2017-04-24 LAB — HEMOGLOBIN A1C
Est. average glucose Bld gHb Est-mCnc: 123 mg/dL
Hgb A1c MFr Bld: 5.9 % — ABNORMAL HIGH (ref 4.8–5.6)

## 2017-04-24 LAB — TSH: TSH: 2.45 u[IU]/mL (ref 0.450–4.500)

## 2017-06-01 DIAGNOSIS — M150 Primary generalized (osteo)arthritis: Secondary | ICD-10-CM | POA: Diagnosis not present

## 2017-06-01 DIAGNOSIS — M2550 Pain in unspecified joint: Secondary | ICD-10-CM | POA: Diagnosis not present

## 2017-08-05 ENCOUNTER — Other Ambulatory Visit: Payer: Self-pay

## 2017-08-05 NOTE — Telephone Encounter (Signed)
Pharmacy sent request for refill on Meloxicam. Reviewed chart and medication was last filled by a previous provider.  Request sent to Dr. Sharee Holsterpalski for review.  Patient's last office visit was 04/20/2017. MPulliam, CMA/RT(R)

## 2017-08-05 NOTE — Telephone Encounter (Signed)
I Believe she takes this for arthritis and it should come from her rheumatologist, who treats her for her arthritis

## 2017-08-07 NOTE — Telephone Encounter (Signed)
Called patient and she states that rheumatologist is no longer going to mange her arthritis.  They advised patient to PCP to manage treatment and medications. MPulliam, CMA/RT(R)

## 2017-08-12 MED ORDER — MELOXICAM 15 MG PO TABS: 15.0000 mg | ORAL_TABLET | Freq: Every day | ORAL | 0 refills | Status: DC

## 2017-08-12 NOTE — Telephone Encounter (Signed)
Needs office visit prior to another refill

## 2017-10-29 ENCOUNTER — Encounter: Payer: Self-pay | Admitting: Family Medicine

## 2017-10-29 ENCOUNTER — Ambulatory Visit (INDEPENDENT_AMBULATORY_CARE_PROVIDER_SITE_OTHER): Payer: BLUE CROSS/BLUE SHIELD | Admitting: Family Medicine

## 2017-10-29 VITALS — BP 117/76 | HR 65 | Ht 68.0 in | Wt 184.2 lb

## 2017-10-29 DIAGNOSIS — E559 Vitamin D deficiency, unspecified: Secondary | ICD-10-CM | POA: Insufficient documentation

## 2017-10-29 DIAGNOSIS — G4700 Insomnia, unspecified: Secondary | ICD-10-CM

## 2017-10-29 DIAGNOSIS — E663 Overweight: Secondary | ICD-10-CM

## 2017-10-29 DIAGNOSIS — Z79899 Other long term (current) drug therapy: Secondary | ICD-10-CM

## 2017-10-29 DIAGNOSIS — R7302 Impaired glucose tolerance (oral): Secondary | ICD-10-CM | POA: Diagnosis not present

## 2017-10-29 DIAGNOSIS — Z791 Long term (current) use of non-steroidal anti-inflammatories (NSAID): Secondary | ICD-10-CM

## 2017-10-29 MED ORDER — RANITIDINE HCL 75 MG PO TABS: 75.0000 mg | ORAL_TABLET | Freq: Two times a day (BID) | ORAL | 3 refills | Status: DC

## 2017-10-29 NOTE — Progress Notes (Signed)
Impression and Recommendations:    1. Glucose intolerance (impaired glucose tolerance)   2. Vitamin D deficiency   3. Overweight (BMI 25.0-29.9)   4. Insomnia, unspecified type   5. High risk medication use   6. NSAID long-term use    Patient is fasting today.  Vitamin D Deficiency - Continue taking 5000 IU's Vitamin D daily.  - Vitamin D will be measured today. - Reviewed that goal is in 50-60 range.  Insomnia - Patient continues taking trazadone 150 and melatonin.    - Patient may continue as needed, but advised attempting to transition to completely natural sleep aid.  - Advised use of a white noise machine, relaxing music, and sleep meditation/relaxation videos, readily available on YouTube.  Osteoarthritis - Shifted chronic care for OA from Dr. Zenovia JordanAngela Hawkes.  - Patient takes meloxicam chronically to treat OA pain.  If she continues taking meloxicam, she may need to add a medication like Zantac to protect her stomach lining.  - Reviewed with the patient today that chronic use of NSAID's can lead to issues like chronic gastritis and stomach ulcers.  Patient has no existing history of heartburn, reflux, stomach ulcers.   - Insurance may cover 150 mg Ranitidine - patient will start taking this either OTC or via prescription.  - Reviewed that injections to control her OA pain are always an option in the future.  General Health Maintenance - Advised patient to continue working toward exercising to improve health.    - Patient may begin slow, with 10-15 minutes of activity daily.  Recommended that the patient eventually strive for at least 150 minutes of cardio per week according to the [facility].   - Healthy dietary habits encouraged, including low-carb, and high amounts of lean protein in diet.   - Patient encouraged to continue following her preventative prediabetes diet.  - Patient should also consume adequate amounts of water - half of body weight in oz of water  per day.  IBS - Reviewed that exercise increases blood flow to the colon, which helps bowels move.  - Noted with the patient that adequate hydration is also important for bowel movement.  - Emphasized trying Miralax or a similar laxative to help keep her bowels steady.  Follow-Up - Labs drawn today to evaluate A1c, Vitamin D, and kidney function - BMP.  - Patient will come fasting for blood work prior to her next visit, CPE in 6 months.   Orders Placed This Encounter  Procedures  . Hemoglobin A1c  . VITAMIN D 25 Hydroxy (Vit-D Deficiency, Fractures)  . Comprehensive metabolic panel    Meds ordered this encounter  Medications  . ranitidine (ZANTAC) 75 MG tablet    Sig: Take 1 tablet (75 mg total) by mouth 2 (two) times daily.    Dispense:  180 tablet    Refill:  3    Gross side effects, risk and benefits, and alternatives of medications and treatment plan in general discussed with patient.  Patient is aware that all medications have potential side effects and we are unable to predict every side effect or drug-drug interaction that may occur.   Patient will call with any questions prior to using medication if they have concerns.  Expresses verbal understanding and consents to current therapy and treatment regimen.  No barriers to understanding were identified.  Red flag symptoms and signs discussed in detail.  Patient expressed understanding regarding what to do in case of emergency\urgent symptoms  Please see  AVS handed out to patient at the end of our visit for further patient instructions/ counseling done pertaining to today's office visit.   Return for 6 mo- come fasting for blwrk.    Note: This note was prepared with assistance of Dragon voice recognition software. Occasional wrong-word or sound-a-like substitutions may have occurred due to the inherent limitations of voice recognition software.   This document serves as a record of services personally performed by Thomasene Lot, DO. It was created on her behalf by [name], a trained medical scribe. The creation of this record is based on the scribe's personal observations and the provider's statements to them.   I have reviewed the above medical documentation for accuracy and completeness and I concur.  Thomasene Lot 11/08/17 7:24 PM   -------------------------------------------------------------------------------------------------------------------------------------------------------------------    Subjective:     HPI: Robyn Roberts is a 62 y.o. female who presents to [facility] today for issues as discussed below.  Notes that "everything's good."    Vitamin D Deficiency Has begun taking 5000 IU's Vitamin D, and is fasting today in preparation for blood work.  Notes that she hasn't felt much difference in energy levels or aching since starting Vitamin D.  Insomnia Continues taking trazadone 150 and melatonin 10 mg.  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  "It's just the way my life is."  Osteoarthritis Patient currently takes Meloxicam chronically to treat her osteoarthritis pain.  Notes that when she doesn't take the Meloxicam, she really notices how much it actually helps.  Lifestyle & Exercise Habits Patient has been trying to be mindful of her diet given her prediabetes status.  She feels like she's eating better. Notes attempting to eat less fried foods and drinking more water.  Patient notes that she doesn't eat a lot of pasta, but that it's just easy to go out and eat, which is unhealthy.  Has tried to get started exercising again.  No new chest pain, SOB, dizziness, visual change, upset stomach, diarrhea, vomiting.  Denies alarming symptoms.  Patient has chronic constipation but this is treated with Linzess.  She tries to take the Linzess every day as long as she's near a bathroom.  Patient does not  take Miralax, but Dr. Loreta Ave gave her a laxative to try, similar to Miralax.   Wt Readings from Last 3 Encounters:  10/29/17 184 lb 3.2 oz (83.6 kg)  04/20/17 182 lb 11.2 oz (82.9 kg)  09/04/16 188 lb 3.2 oz (85.4 kg)   BP Readings from Last 3 Encounters:  10/29/17 117/76  04/23/17 110/71  04/20/17 100/68   Pulse Readings from Last 3 Encounters:  10/29/17 65  04/23/17 61  04/20/17 76   BMI Readings from Last 3 Encounters:  10/29/17 28.01 kg/m  04/20/17 27.58 kg/m  09/04/16 28.41 kg/m     Patient Care Team    Relationship Specialty Notifications Start End  Thomasene Lot, DO PCP - General Family Medicine  04/03/16   Lynden Ang, NP Nurse Practitioner Obstetrics and Gynecology  04/03/16   Zenovia Jordan, MD Consulting Physician Rheumatology  04/20/17   Charna Elizabeth, MD Consulting Physician Gastroenterology  04/20/17      Patient Active Problem List   Diagnosis Date Noted  . Vitamin D deficiency 10/29/2017  . Glucose intolerance (impaired glucose tolerance) 10/29/2017  . Perimenopausal- last mense 2009 05/19/2016  . Osteoarthritis of carpometacarpal [facility]) joint of both thumbs L>er R 05/19/2016  .  Arthritis- b/l knees 05/19/2016  . Environmental and seasonal allergies 04/05/2016  . Generalized OA- hands, back, neck, hips, knees 04/05/2016  . Overweight (BMI 25.0-29.9) 04/05/2016  . Idiopathic scoliosis 04/03/2016  . Family history of colon cancer- father age late 10's 04/03/2016  . Chronic back pain 05/03/2015  . Insomnia 12/10/2005  . Difficulty hearing 11/12/2005    Past Medical history, Surgical history, Family history, Social history, Allergies and Medications have been entered into the medical record, reviewed and changed as needed.    Current Meds  Medication Sig  . fexofenadine (ALLEGRA ALLERGY) 180 MG tablet Take 1 tablet by mouth daily.  Marland Kitchen LINZESS 72 MCG capsule Take 1 tablet by mouth daily.  . Melatonin 5 MG TABS Take 1 tablet by mouth as needed.  .  meloxicam (MOBIC) 15 MG tablet Take 1 tablet (15 mg total) by mouth daily.  . MULTIPLE VITAMIN PO Take 1 tablet by mouth daily.  . traZODone (DESYREL) 100 MG tablet Take 1.5 tablets (150 mg total) by mouth at bedtime as needed for sleep. PATIENT MUST HAVE OFFICE VISIT PRIOR TO ANY FURTHER REFILLS    Allergies:  Allergies  Allergen Reactions  . Aspirin Swelling  . Penicillins Swelling  . Sulfa Antibiotics Hives     Review of Systems:  A fourteen system review of systems was performed and found to be positive as per HPI.   Objective:   Blood pressure 117/76, pulse 65, height 5\' 8"  (1.727 m), weight 184 lb 3.2 oz (83.6 kg), SpO2 97 %. Body mass index is 28.01 kg/m. General:  Well Developed, well nourished, appropriate for stated age.  Neuro:  Alert and oriented,  extra-ocular muscles intact  HEENT:  Normocephalic, atraumatic, neck supple, no carotid bruits appreciated  Skin:  no gross rash, warm, pink. Cardiac:  RRR, S1 S2 Respiratory:  ECTA B/L and A/P, Not using accessory muscles, speaking in full sentences- unlabored. Vascular:  Ext warm, no cyanosis apprec.; cap RF less 2 sec. Psych:  No HI/SI, judgement and insight good, Euthymic mood. Full Affect.

## 2017-10-30 LAB — COMPREHENSIVE METABOLIC PANEL
ALT: 68 IU/L — ABNORMAL HIGH (ref 0–32)
AST: 37 IU/L (ref 0–40)
Albumin/Globulin Ratio: 2 (ref 1.2–2.2)
Albumin: 4.2 g/dL (ref 3.6–4.8)
Alkaline Phosphatase: 89 IU/L (ref 39–117)
BUN/Creatinine Ratio: 26 (ref 12–28)
BUN: 20 mg/dL (ref 8–27)
Bilirubin Total: 0.6 mg/dL (ref 0.0–1.2)
CO2: 22 mmol/L (ref 20–29)
Calcium: 9.2 mg/dL (ref 8.7–10.3)
Chloride: 103 mmol/L (ref 96–106)
Creatinine, Ser: 0.78 mg/dL (ref 0.57–1.00)
GFR calc Af Amer: 96 mL/min/{1.73_m2} (ref >59)
GFR calc non Af Amer: 83 mL/min/{1.73_m2} (ref >59)
Globulin, Total: 2.1 g/dL (ref 1.5–4.5)
Glucose: 95 mg/dL (ref 65–99)
Potassium: 4.4 mmol/L (ref 3.5–5.2)
Sodium: 143 mmol/L (ref 134–144)
Total Protein: 6.3 g/dL (ref 6.0–8.5)

## 2017-10-30 LAB — HEMOGLOBIN A1C
Est. average glucose Bld gHb Est-mCnc: 120 mg/dL
Hgb A1c MFr Bld: 5.8 % — ABNORMAL HIGH (ref 4.8–5.6)

## 2017-10-30 LAB — VITAMIN D 25 HYDROXY (VIT D DEFICIENCY, FRACTURES): Vit D, 25-Hydroxy: 40.9 ng/mL (ref 30.0–100.0)

## 2017-11-08 DIAGNOSIS — Z79899 Other long term (current) drug therapy: Secondary | ICD-10-CM | POA: Insufficient documentation

## 2017-11-08 DIAGNOSIS — Z791 Long term (current) use of non-steroidal anti-inflammatories (NSAID): Secondary | ICD-10-CM | POA: Insufficient documentation

## 2017-11-10 ENCOUNTER — Other Ambulatory Visit: Payer: Self-pay | Admitting: Family Medicine

## 2018-02-08 ENCOUNTER — Other Ambulatory Visit: Payer: Self-pay | Admitting: Family Medicine

## 2018-03-04 ENCOUNTER — Other Ambulatory Visit: Payer: Self-pay | Admitting: Family Medicine

## 2018-03-04 DIAGNOSIS — Z1231 Encounter for screening mammogram for malignant neoplasm of breast: Secondary | ICD-10-CM

## 2018-04-08 ENCOUNTER — Ambulatory Visit
Admission: RE | Admit: 2018-04-08 | Discharge: 2018-04-08 | Disposition: A | Discharge status: Home / Self Care | Payer: BLUE CROSS/BLUE SHIELD | Source: Ambulatory Visit | Attending: Family Medicine | Admitting: Family Medicine

## 2018-04-08 DIAGNOSIS — Z1231 Encounter for screening mammogram for malignant neoplasm of breast: Secondary | ICD-10-CM | POA: Diagnosis not present

## 2018-04-26 ENCOUNTER — Other Ambulatory Visit: Payer: Self-pay | Admitting: Family Medicine

## 2018-04-26 DIAGNOSIS — G4700 Insomnia, unspecified: Secondary | ICD-10-CM

## 2018-04-29 ENCOUNTER — Ambulatory Visit (INDEPENDENT_AMBULATORY_CARE_PROVIDER_SITE_OTHER): Payer: BLUE CROSS/BLUE SHIELD | Admitting: Family Medicine

## 2018-04-29 ENCOUNTER — Encounter: Payer: Self-pay | Admitting: Family Medicine

## 2018-04-29 VITALS — BP 124/85 | HR 67 | Ht 68.0 in | Wt 183.3 lb

## 2018-04-29 DIAGNOSIS — Z0000 Encounter for general adult medical examination without abnormal findings: Secondary | ICD-10-CM | POA: Diagnosis not present

## 2018-04-29 DIAGNOSIS — E559 Vitamin D deficiency, unspecified: Secondary | ICD-10-CM | POA: Diagnosis not present

## 2018-04-29 DIAGNOSIS — M159 Polyosteoarthritis, unspecified: Secondary | ICD-10-CM

## 2018-04-29 DIAGNOSIS — Z719 Counseling, unspecified: Secondary | ICD-10-CM

## 2018-04-29 DIAGNOSIS — R7302 Impaired glucose tolerance (oral): Secondary | ICD-10-CM

## 2018-04-29 DIAGNOSIS — E663 Overweight: Secondary | ICD-10-CM | POA: Diagnosis not present

## 2018-04-29 NOTE — Progress Notes (Signed)
Impression and Recommendations:    1. Encounter for wellness examination   2. Health education/counseling   3. Overweight (BMI 25.0-29.9)   4. Vitamin D deficiency   5. Glucose intolerance (impaired glucose tolerance)   6. Generalized OA- hands, back, neck, hips, knees      1) Anticipatory Guidance: Discussed importance of wearing a seatbelt while driving, not texting while driving; sunscreen when outside along with yearly skin surveillance; eating a well balanced and modest diet; physical activity at least 25 minutes per day or 150 min/ week of moderate to intense activity.  - Discussed prudent skin screening habits with the patient today.  - Educated patient at length about anticipatory healthcare habits.  2) Immunizations / Screenings / Labs:  All immunizations and screenings that patient agrees to, are up-to-date per recommendations or will be updated today.  Patient understands the needs for q 27mo dental and yearly vision screens which pt will schedule independently. Obtain CBC, CMP, HgA1c, Lipid panel, TSH and vit D when fasting if not already done recently.   - Educated patient about need for shingles vaccine in office today.  Extensive counseling provided.  - Encouraged patient to send all results of pap smears, colonoscopies, and other screenings to the clinic here for the purposes of records.  Last colonoscopy was 09/22/2016, repeat 5 years Last pap smear was 09/10/2011; was normal.  Patient scheduled for repeat pap smear on 05/17/2018. Last mammogram was September 04/08/2018.  3) Weight:   Discussed goal of losing even 5-10% of current body weight which would improve overall feelings of well being and improve objective health data significantly.   Improve nutrient density of diet through increasing intake of fruits and vegetables and decreasing saturated/trans fats, white flour products and refined sugar products.   4) BMI Counseling: Explained to patient what BMI  refers to, and what it means medically.    Told patient to think about it as a "medical risk stratification measurement" and how increasing BMI is associated with increasing risk/ or worsening state of various diseases such as hypertension, hyperlipidemia, diabetes, premature OA, depression etc.  American Heart Association guidelines for healthy diet, basically Mediterranean diet, and exercise guidelines of 30 minutes 5 days per week or more discussed in detail.  Health counseling performed.  All questions answered.  5) Lifestyle & Preventative Health Maintenance: - Advised patient to continue working toward exercising to improve overall mental, physical, and emotional health.    - Encouraged patient to begin weight-bearing exercises, such as lifting weights twice per week, or walking regularly outside.  - Encouraged patient to engage in daily physical activity, especially a formal exercise routine.  Recommended that the patient eventually strive for at least 150-300 minutes of moderate cardiovascular activity per week according to guidelines established by the [facility].   - Healthy dietary habits encouraged, including low-carb, and high amounts of lean protein in diet.   - Patient should also consume adequate amounts of water - half of body weight in oz of water per day.  6) Follow-Up: - Prescriptions refilled today. - Patient is fasting today.  Re-check fasting lab work today. - Otherwise, continue to return for CPE and chronic follow-up as scheduled.   - Patient knows to call in if desired to address acute concerns.    Orders Placed This Encounter  Procedures  . CBC with Differential/Platelet  . Comprehensive metabolic panel  . Hemoglobin A1c  . Lipid panel  . T4, free  . TSH  . VITAMIN  D 25 Hydroxy (Vit-D Deficiency, Fractures)    Gross side effects, risk and benefits, and alternatives of medications discussed with patient.  Patient is aware that all medications have potential side  effects and we are unable to predict every side effect or drug-drug interaction that may occur.  Expresses verbal understanding and consents to current therapy plan and treatment regimen.  F-up preventative CPE in 1 year. F/up sooner for chronic care management as discussed and/or prn.  Please see orders placed and AVS handed out to patient at the end of our visit for further patient instructions/ counseling done pertaining to today's office visit.  This document serves as a record of services personally performed by Thomasene Lot, DO. It was created on her behalf by [name], a trained medical scribe. The creation of this record is based on the scribe's personal observations and the provider's statements to them.   I have reviewed the above medical documentation for accuracy and completeness and I concur.  Thomasene Lot 04/29/18 1:15 PM    Subjective:    Chief Complaint  Patient presents with  . Annual Exam    HPI: MINDA FAAS is a 62 y.o. female who presents to [facility] at [facility] today a yearly health maintenance exam.  Health Maintenance Summary Reviewed and updated, unless pt declines services.  Colonoscopy:   Last colonoscopy was 09/22/2016; repeats every 5 years. Her father had colon cancer- late 75's. Tobacco History Reviewed:   Never smoker. Alcohol:    No concerns, no excessive use. Exercise Habits:   Not meeting AHA guidelines. STD concerns:   None. Drug Use:   None. Birth control method:   N/a. Menses regular:     N/a. Lumps or breast concerns:    No. Last mammogram was September 04/08/2018 (this year). Breast Cancer Family History:  States that dad's sisters had breast cancer.  Otherwise denies family history of breast, cervical, or ovarian cancers.  Denies unusual fractures or family history of osteoporosis, denies onset of osteoporosis at young ages.   Need for Shingles Vaccine Patient has never had the shingles  vaccine.  OBGYN Health Patient follows up regularly with OBGYN.  Last pap was 09/10/2011, and it was normal. Patient has upcoming pap smear scheduled for 28th of this month.  Dermatological Health Patient does not follow up with dermatology. Denies family history of skin cancers or melanoma.  Visual Health Patient hasn't had her eyes checked in 10-15 years. She knows she needs to return, but has felt discouraged given her insurance.  Exercise Habits Patient tries to exercise formally as often as she can.  States she usually walks, and is usually able to walk twice weekly.  Denies chest pain, SOB, dizziness, or visual changes.  Patient struggles with IBS, but experiencing no unusual GI discomfort, constipation, or diarrhea.  Denies new urinary symptoms.  Denies sexual health concerns.  She believes she urinates twice per night.  She drinks water in the evening.    Immunization History  Administered Date(s) Administered  . Influenza,inj,Quad PF,6+ Mos 04/20/2017, 04/23/2017  . Influenza-Unspecified 05/05/2016  . Td 07/21/2008    Health Maintenance  Topic Date Due  . PAP SMEAR  04/29/2018 (Originally 09/09/2014)  . Hepatitis C Screening  10/30/2018 (Originally Aug 12, 1955)  . HIV Screening  10/30/2018 (Originally 06/11/1972)  . INFLUENZA VACCINE  04/21/2019 (Originally 02/18/2018)  . TETANUS/TDAP  07/21/2018  . MAMMOGRAM  04/08/2020  . COLONOSCOPY  09/23/2026     Wt Readings from Last  3 Encounters:  04/29/18 183 lb 4.8 oz (83.1 kg)  10/29/17 184 lb 3.2 oz (83.6 kg)  04/20/17 182 lb 11.2 oz (82.9 kg)   BP Readings from Last 3 Encounters:  04/29/18 124/85  10/29/17 117/76  04/23/17 110/71   Pulse Readings from Last 3 Encounters:  04/29/18 67  10/29/17 65  04/23/17 61     Past Medical History:  Diagnosis Date  . Arthritis   . Chronic back pain   . Scoliosis       Past Surgical History:  Procedure Laterality Date  . CESAREAN SECTION  1991  . FOOT SURGERY  Bilateral 1985   bunionectomy      Family History  Problem Relation Age of Onset  . Heart disease Mother   . Colon polyps Mother   . Cancer Mother        thyroid  . Asthma Mother   . Cancer Father        colon  . Diabetes Father   . Depression Father   . CVA Father   . Breast cancer Neg Hx       Social History   Substance and Sexual Activity  Drug Use No  ,   Social History   Substance and Sexual Activity  Alcohol Use Yes   Comment: rare  ,   Social History   Tobacco Use  Smoking Status Never Smoker  Smokeless Tobacco Never Used  ,   Social History   Substance and Sexual Activity  Sexual Activity Yes    Current Outpatient Medications on File Prior to Visit  Medication Sig Dispense Refill  . LINZESS 72 MCG capsule Take 1 tablet by mouth daily.    Marland Kitchen loratadine (CLARITIN) 10 MG tablet Take 10 mg by mouth daily.    . Melatonin 10 MG TABS Take 1 tablet by mouth as needed.     . meloxicam (MOBIC) 15 MG tablet TAKE 1 TABLET DAILY 90 tablet 0  . MULTIPLE VITAMIN PO Take 1 tablet by mouth daily.    . ranitidine (ZANTAC) 75 MG tablet Take 1 tablet (75 mg total) by mouth 2 (two) times daily. 180 tablet 3  . traZODone (DESYREL) 100 MG tablet Take 1.5 tablets (150 mg total) by mouth at bedtime as needed for sleep. 135 tablet 1   No current facility-administered medications on file prior to visit.     Allergies: Aspirin; Penicillins; and Sulfa antibiotics  Review of Systems: General:   Denies fever, chills, unexplained weight loss.  Optho/Auditory:   Denies visual changes, blurred vision/LOV Respiratory:   Denies SOB, DOE more than baseline levels.  Cardiovascular:   Denies chest pain, palpitations, new onset peripheral edema  Gastrointestinal:   Denies nausea, vomiting, diarrhea.  Genitourinary: Denies dysuria, freq/ urgency, flank pain or discharge from genitals.  Endocrine:     Denies hot or cold intolerance, polyuria, polydipsia. Musculoskeletal:    Denies unexplained myalgias, joint swelling, unexplained arthralgias, gait problems.  Skin:  Denies rash, suspicious lesions Neurological:     Denies dizziness, unexplained weakness, numbness  Psychiatric/Behavioral:   Denies mood changes, suicidal or homicidal ideations, hallucinations    Objective:    Blood pressure 124/85, pulse 67, height 5\' 8"  (1.727 m), weight 183 lb 4.8 oz (83.1 kg), SpO2 98 %. Body mass index is 27.87 kg/m. General Appearance:    Alert, cooperative, no distress, appears stated age  Head:    Normocephalic, without obvious abnormality, atraumatic  Eyes:    PERRL, conjunctiva/corneas clear, EOM's intact,  fundi    benign, both eyes  Ears:    Normal TM's and external ear canals, both ears  Nose:   Nares normal, septum midline, mucosa normal, no drainage    or sinus tenderness  Throat:   Lips w/o lesion, mucosa moist, and tongue normal; teeth and   gums normal  Neck:   Supple, symmetrical, trachea midline, no adenopathy;    thyroid:  no enlargement/tenderness/nodules; no carotid   bruit or JVD  Back:     Symmetric, no curvature, ROM normal, no CVA tenderness  Lungs:     Clear to auscultation bilaterally, respirations unlabored, no       Wh/ R/ R  Chest Wall:    No tenderness or gross deformity; normal excursion   Heart:    Regular rate and rhythm, S1 and S2 normal, no murmur, rub   or gallop  Breast Exam:    Deferred to OBGYN.  Abdomen:     Soft, non-tender, bowel sounds active all four quadrants, NO   G/R/R, no masses, no organomegaly  Genitalia:   Deferred to OBGYN.  Rectal:   Deferred to OBGYN.  Extremities:   Extremities normal, atraumatic, no cyanosis or gross edema  Pulses:   2+ and symmetric all extremities  Skin:   Warm, dry, Skin color, texture, turgor normal, no obvious rashes or lesions Psych: No HI/SI, judgement and insight good, Euthymic mood. Full Affect.  Neurologic:   CNII-XII intact, normal strength, sensation and reflexes    Throughout

## 2018-04-29 NOTE — Patient Instructions (Addendum)
-Robyn Roberts please give patient Hemoccult cards to use at home.  She had her flu vaccine at work.  Also, please give her information on the Shingrix vaccine.    Robyn Roberts, please call your insurance and find out if they require you to get it at a specific pharmacy or if that something you could get with us-which ever is cheaper for you is what I recommend.  However, this is important vaccine to get in the very near future.     Risk factors for prediabetes and type 2 diabetes  Researchers don't fully understand why some people develop prediabetes and type 2 diabetes and others don't.  It's clear that certain factors increase the risk, however, including:  Weight. The more fatty tissue you have, the more resistant your cells become to insulin.  Inactivity. The less active you are, the greater your risk. Physical activity helps you control your weight, uses up glucose as energy and makes your cells more sensitive to insulin.  Family history. Your risk increases if a parent or sibling has type 2 diabetes.  Race. Although it's unclear why, people of certain races - including blacks, Hispanics, American Indians and Asian-Americans - are at higher risk.  Age. Your risk increases as you get older. This may be because you tend to exercise less, lose muscle mass and gain weight as you age. But type 2 diabetes is also increasing dramatically among children, adolescents and younger adults.  Gestational diabetes. If you developed gestational diabetes when you were pregnant, your risk of developing prediabetes and type 2 diabetes later increases. If you gave birth to a baby weighing more than 9 pounds (4 kilograms), you're also at risk of type 2 diabetes.  Polycystic ovary syndrome. For women, having polycystic ovary syndrome - a common condition characterized by irregular menstrual periods, excess hair growth and obesity - increases the risk of diabetes.  High blood pressure. Having blood pressure over 140/90  millimeters of mercury (mm Hg) is linked to an increased risk of type 2 diabetes.  Abnormal cholesterol and triglyceride levels. If you have low levels of high-density lipoprotein (HDL), or "good," cholesterol, your risk of type 2 diabetes is higher. Triglycerides are another type of fat carried in the blood. People with high levels of triglycerides have an increased risk of type 2 diabetes. Your doctor can let you know what your cholesterol and triglyceride levels are.  A good guide to good carbs: The glycemic index ---If you have diabetes, or at risk for diabetes, you know all too well that when you eat carbohydrates, your blood sugar goes up. The total amount of carbs you consume at a meal or in a snack mostly determines what your blood sugar will do. But the food itself also plays a role. A serving of white rice has almost the same effect as eating pure table sugar - a quick, high spike in blood sugar. A serving of lentils has a slower, smaller effect.  ---Picking good sources of carbs can help you control your blood sugar and your weight. Even if you don't have diabetes, eating healthier carbohydrate-rich foods can help ward off a host of chronic conditions, from heart disease to various cancers to, well, diabetes.  ---One way to choose foods is with the glycemic index (GI). This tool measures how much a food boosts blood sugar.  The glycemic index rates the effect of a specific amount of a food on blood sugar compared with the same amount of pure glucose. A food with  a glycemic index of 28 boosts blood sugar only 28% as much as pure glucose. One with a GI of 95 acts like pure glucose.    High glycemic foods result in a quick spike in insulin and blood sugar (also known as blood glucose).  Low glycemic foods have a slower, smaller effect- these are healthier for you.   Using the glycemic index Using the glycemic index is easy: choose foods in the low GI category instead of those in the high GI  category (see below), and go easy on those in between. Low glycemic index (GI of 55 or less): Most fruits and vegetables, beans, minimally processed grains, pasta, low-fat dairy foods, and nuts.  Moderate glycemic index (GI 56 to 69): White and sweet potatoes, corn, white rice, couscous, breakfast cereals such as Cream of Wheat and Mini Wheats.  High glycemic index (GI of 70 or higher): White bread, rice cakes, most crackers, bagels, cakes, doughnuts, croissants, most packaged breakfast cereals. You can see the values for 100 commons foods and get links to more at www.[email or web address].si.  Swaps for lowering glycemic index  Instead of this high-glycemic index food Eat this lower-glycemic index food  White rice Brown rice or converted rice  Instant oatmeal Steel-cut oats  Cornflakes Bran flakes  Baked potato Pasta, bulgur  White bread Whole-grain bread  Corn Peas or leafy greens       Prediabetes Eating Plan  Prediabetes--also called impaired glucose tolerance or impaired fasting glucose--is a condition that causes blood sugar (blood glucose) levels to be higher than normal. Following a healthy diet can help to keep prediabetes under control. It can also help to lower the risk of type 2 diabetes and heart disease, which are increased in people who have prediabetes. Along with regular exercise, a healthy diet:  Promotes weight loss.  Helps to control blood sugar levels.  Helps to improve the way that the body uses insulin.   WHAT DO I NEED TO KNOW ABOUT THIS EATING PLAN?   Use the glycemic index (GI) to plan your meals. The index tells you how quickly a food will raise your blood sugar. Choose low-GI foods. These foods take a longer time to raise blood sugar.  Pay close attention to the amount of carbohydrates in the food that you eat. Carbohydrates increase blood sugar levels.  Keep track of how many calories you take in. Eating the right amount of calories will help you  to achieve a healthy weight. Losing about 7 percent of your starting weight can help to prevent type 2 diabetes.  You may want to follow a Mediterranean diet. This diet includes a lot of vegetables, lean meats or fish, whole grains, fruits, and healthy oils and fats.   WHAT FOODS CAN I EAT?  Grains Whole grains, such as whole-wheat or whole-grain breads, crackers, cereals, and pasta. Unsweetened oatmeal. Bulgur. Barley. Quinoa. Brown rice. Corn or whole-wheat flour tortillas or taco shells. Vegetables Lettuce. Spinach. Peas. Beets. Cauliflower. Cabbage. Broccoli. Carrots. Tomatoes. Squash. Eggplant. Herbs. Peppers. Onions. Cucumbers. Brussels sprouts. Fruits Berries. Bananas. Apples. Oranges. Grapes. Papaya. Mango. Pomegranate. Kiwi. Grapefruit. Cherries. Meats and Other Protein Sources Seafood. Lean meats, such as chicken and [place] or lean cuts of pork and beef. Tofu. Eggs. Nuts. Beans. Dairy Low-fat or fat-free dairy products, such as yogurt, cottage cheese, and cheese. Beverages Water. Tea. Coffee. Sugar-free or diet soda. Seltzer water. Milk. Milk alternatives, such as soy or almond milk. Condiments Mustard. Relish. Low-fat, low-sugar ketchup. Low-fat, low-sugar barbecue sauce.  Low-fat or fat-free mayonnaise. Sweets and Desserts Sugar-free or low-fat pudding. Sugar-free or low-fat ice cream and other frozen treats. Fats and Oils Avocado. Walnuts. Olive oil. The items listed above may not be a complete list of recommended foods or beverages. Contact your dietitian for more options.    WHAT FOODS ARE NOT RECOMMENDED?  Grains Refined white flour and flour products, such as bread, pasta, snack foods, and cereals. Beverages Sweetened drinks, such as sweet iced tea and soda. Sweets and Desserts Baked goods, such as cake, cupcakes, pastries, cookies, and cheesecake. The items listed above may not be a complete list of foods and beverages to avoid. Contact your dietitian for more  information.   This information is not intended to replace advice given to you by your health care provider. Make sure you discuss any questions you have with your health care provider.   Document Released: 11/21/2014 Document Reviewed: 11/21/2014 Elsevier Interactive Patient Education 2016 [place] for Adults, Female  A healthy lifestyle and preventive care can promote health and wellness. Preventive health guidelines for women include the following key practices.   A routine yearly physical is a good way to check with your health care provider about your health and preventive screening. It is a chance to share any concerns and updates on your health and to receive a thorough exam.   Visit your dentist for a routine exam and preventive care every 6 months. Brush your teeth twice a day and floss once a day. Good oral hygiene prevents tooth decay and gum disease.   The frequency of eye exams is based on your age, health, family medical history, use of contact lenses, and other factors. Follow your health care provider's recommendations for frequency of eye exams.   Eat a healthy diet. Foods like vegetables, fruits, whole grains, low-fat dairy products, and lean protein foods contain the nutrients you need without too many calories. Decrease your intake of foods high in solid fats, added sugars, and salt. Eat the right amount of calories for you.Get information about a proper diet from your health care provider, if necessary.   Regular physical exercise is one of the most important things you can do for your health. Most adults should get at least 150 minutes of moderate-intensity exercise (any activity that increases your heart rate and causes you to sweat) each week. In addition, most adults need muscle-strengthening exercises on 2 or more days a week.   Maintain a healthy weight. The body mass index (BMI) is a screening tool to identify possible weight  problems. It provides an estimate of body fat based on height and weight. Your health care provider can find your BMI, and can help you achieve or maintain a healthy weight.For adults 20 years and older:   - A BMI below 18.5 is considered underweight.   - A BMI of 18.5 to 24.9 is normal.   - A BMI of 25 to 29.9 is considered overweight.   - A BMI of 30 and above is considered obese.   Maintain normal blood lipids and cholesterol levels by exercising and minimizing your intake of trans and saturated fats.  Eat a balanced diet with plenty of fruit and vegetables. Blood tests for lipids and cholesterol should begin at age 55 and be repeated every 5 years minimum.  If your lipid or cholesterol levels are high, you are over 40, or you are at high risk for heart disease, you may need  your cholesterol levels checked more frequently.Ongoing high lipid and cholesterol levels should be treated with medicines if diet and exercise are not working.   If you smoke, find out from your health care provider how to quit. If you do not use tobacco, do not start.   Lung cancer screening is recommended for adults aged 64-80 years who are at high risk for developing lung cancer because of a history of smoking. A yearly low-dose CT scan of the lungs is recommended for people who have at least a 30-pack-year history of smoking and are a current smoker or have quit within the past 15 years. A pack year of smoking is smoking an average of 1 pack of cigarettes a day for 1 year (for example: 1 pack a day for 30 years or 2 packs a day for 15 years). Yearly screening should continue until the smoker has stopped smoking for at least 15 years. Yearly screening should be stopped for people who develop a health problem that would prevent them from having lung cancer treatment.   If you are pregnant, do not drink alcohol. If you are breastfeeding, be very cautious about drinking alcohol. If you are not pregnant and choose to drink  alcohol, do not have more than 1 drink per day. One drink is considered to be 12 ounces (355 mL) of beer, 5 ounces (148 mL) of wine, or 1.5 ounces (44 mL) of liquor.   Avoid use of street drugs. Do not share needles with anyone. Ask for help if you need support or instructions about stopping the use of drugs.   High blood pressure causes heart disease and increases the risk of stroke. Your blood pressure should be checked at least yearly.  Ongoing high blood pressure should be treated with medicines if weight loss and exercise do not work.   If you are 34-54 years old, ask your health care provider if you should take aspirin to prevent strokes.   Diabetes screening involves taking a blood sample to check your fasting blood sugar level. This should be done once every 3 years, after age 74, if you are within normal weight and without risk factors for diabetes. Testing should be considered at a younger age or be carried out more frequently if you are overweight and have at least 1 risk factor for diabetes.   Breast cancer screening is essential preventive care for women. You should practice "breast self-awareness."  This means understanding the normal appearance and feel of your breasts and may include breast self-examination.  Any changes detected, no matter how small, should be reported to a health care provider.  Women in their 38s and 30s should have a clinical breast exam (CBE) by a health care provider as part of a regular health exam every 1 to 3 years.  After age 76, women should have a CBE every year.  Starting at age 22, women should consider having a mammogram (breast X-ray test) every year.  Women who have a family history of breast cancer should talk to their health care provider about genetic screening.  Women at a high risk of breast cancer should talk to their health care providers about having an MRI and a mammogram every year.   -Breast cancer gene (BRCA)-related cancer risk  assessment is recommended for women who have family members with BRCA-related cancers. BRCA-related cancers include breast, ovarian, tubal, and peritoneal cancers. Having family members with these cancers may be associated with an increased risk for harmful changes (mutations)  in the breast cancer genes BRCA1 and BRCA2. Results of the assessment will determine the need for genetic counseling and BRCA1 and BRCA2 testing.   The Pap test is a screening test for cervical cancer. A Pap test can show cell changes on the cervix that might become cervical cancer if left untreated. A Pap test is a procedure in which cells are obtained and examined from the lower end of the uterus (cervix).   - Women should have a Pap test starting at age 52.   - Between ages 46 and 33, Pap tests should be repeated every 2 years.   - Beginning at age 31, you should have a Pap test every 3 years as long as the past 3 Pap tests have been normal.   - Some women have medical problems that increase the chance of getting cervical cancer. Talk to your health care provider about these problems. It is especially important to talk to your health care provider if a new problem develops soon after your last Pap test. In these cases, your health care provider may recommend more frequent screening and Pap tests.   - The above recommendations are the same for women who have or have not gotten the vaccine for human papillomavirus (HPV).   - If you had a hysterectomy for a problem that was not cancer or a condition that could lead to cancer, then you no longer need Pap tests. Even if you no longer need a Pap test, a regular exam is a good idea to make sure no other problems are starting.   - If you are between ages 70 and 33 years, and you have had normal Pap tests going back 10 years, you no longer need Pap tests. Even if you no longer need a Pap test, a regular exam is a good idea to make sure no other problems are starting.   - If you  have had past treatment for cervical cancer or a condition that could lead to cancer, you need Pap tests and screening for cancer for at least 20 years after your treatment.   - If Pap tests have been discontinued, risk factors (such as a new sexual partner) need to be reassessed to determine if screening should be resumed.   - The HPV test is an additional test that may be used for cervical cancer screening. The HPV test looks for the virus that can cause the cell changes on the cervix. The cells collected during the Pap test can be tested for HPV. The HPV test could be used to screen women aged 55 years and older, and should be used in women of any age who have unclear Pap test results. After the age of 16, women should have HPV testing at the same frequency as a Pap test.   Colorectal cancer can be detected and often prevented. Most routine colorectal cancer screening begins at the age of 52 years and continues through age 69 years. However, your health care provider may recommend screening at an earlier age if you have risk factors for colon cancer. On a yearly basis, your health care provider may provide home test kits to check for hidden blood in the stool.  Use of a small camera at the end of a tube, to directly examine the colon (sigmoidoscopy or colonoscopy), can detect the earliest forms of colorectal cancer. Talk to your health care provider about this at age 70, when routine screening begins. Direct exam of the colon should be  repeated every 5 -10 years through age 27 years, unless early forms of pre-cancerous polyps or small growths are found.   People who are at an increased risk for hepatitis B should be screened for this virus. You are considered at high risk for hepatitis B if:  -You were born in a country where hepatitis B occurs often. Talk with your health care provider about which countries are considered high risk.  - Your parents were born in a high-risk country and you have not  received a shot to protect against hepatitis B (hepatitis B vaccine).  - You have HIV or AIDS.  - You use needles to inject street drugs.  - You live with, or have sex with, someone who has Hepatitis B.  - You get hemodialysis treatment.  - You take certain medicines for conditions like cancer, organ transplantation, and autoimmune conditions.   Hepatitis C blood testing is recommended for all people born from [birth year] through 1964 and any individual with known risks for hepatitis C.   Practice safe sex. Use condoms and avoid high-risk sexual practices to reduce the spread of sexually transmitted infections (STIs). STIs include gonorrhea, chlamydia, syphilis, trichomonas, herpes, HPV, and human immunodeficiency virus (HIV). Herpes, HIV, and HPV are viral illnesses that have no cure. They can result in disability, cancer, and death. Sexually active women aged 6 years and younger should be checked for chlamydia. Older women with new or multiple partners should also be tested for chlamydia. Testing for other STIs is recommended if you are sexually active and at increased risk.   Osteoporosis is a disease in which the bones lose minerals and strength with aging. This can result in serious bone fractures or breaks. The risk of osteoporosis can be identified using a bone density scan. Women ages 106 years and over and women at risk for fractures or osteoporosis should discuss screening with their health care providers. Ask your health care provider whether you should take a calcium supplement or vitamin D to There are also several preventive steps women can take to avoid osteoporosis and resulting fractures or to keep osteoporosis from worsening. -->Recommendations include:  Eat a balanced diet high in fruits, vegetables, calcium, and vitamins.  Get enough calcium. The recommended total intake of is 1,200 mg daily; for best absorption, if taking supplements, divide doses into 250-500 mg doses throughout the  day. Of the two types of calcium, calcium carbonate is best absorbed when taken with food but calcium citrate can be taken on an empty stomach.  Get enough vitamin D. [employer] and the [place] recommend at least 1,000 IU per day for women age 25 and over who are at risk of vitamin D deficiency. Vitamin D deficiency can be caused by inadequate sun exposure (for example, those who live in [place]).  Avoid alcohol and smoking. Heavy alcohol intake (more than 7 drinks per week) increases the risk of falls and hip fracture and women smokers tend to lose bone more rapidly and have lower bone mass than nonsmokers. Stopping smoking is one of the most important changes women can make to improve their health and decrease risk for disease.  Be physically active every day. Weight-bearing exercise (for example, fast walking, hiking, jogging, and weight training) may strengthen bones or slow the rate of bone loss that comes with aging. Balancing and muscle-strengthening exercises can reduce the risk of falling and fracture.  Consider therapeutic medications. Currently, several types of effective drugs are available. Healthcare providers can  recommend the type most appropriate for each woman.  Eliminate environmental factors that may contribute to accidents. Falls cause nearly 90% of all osteoporotic fractures, so reducing this risk is an important bone-health strategy. Measures include ample lighting, removing obstructions to walking, using nonskid rugs on floors, and placing mats and/or grab bars in showers.  Be aware of medication side effects. Some common medicines make bones weaker. These include a type of steroid drug called glucocorticoids used for arthritis and asthma, some antiseizure drugs, certain sleeping pills, treatments for endometriosis, and some cancer drugs. An overactive thyroid gland or using too much thyroid hormone for an underactive thyroid can also be a problem. If  you are taking these medicines, talk to your doctor about what you can do to help protect your bones.reduce the rate of osteoporosis.    Menopause can be associated with physical symptoms and risks. Hormone replacement therapy is available to decrease symptoms and risks. You should talk to your health care provider about whether hormone replacement therapy is right for you.   Use sunscreen. Apply sunscreen liberally and repeatedly throughout the day. You should seek shade when your shadow is shorter than you. Protect yourself by wearing long sleeves, pants, a wide-brimmed hat, and sunglasses year round, whenever you are outdoors.   Once a month, do a whole body skin exam, using a mirror to look at the skin on your back. Tell your health care provider of new moles, moles that have irregular borders, moles that are larger than a pencil eraser, or moles that have changed in shape or color.   -Stay current with required vaccines (immunizations).   Influenza vaccine. All adults should be immunized every year.  Tetanus, diphtheria, and acellular pertussis (Td, Tdap) vaccine. Pregnant women should receive 1 dose of Tdap vaccine during each pregnancy. The dose should be obtained regardless of the length of time since the last dose. Immunization is preferred during the 27th 36th week of gestation. An adult who has not previously received Tdap or who does not know her vaccine status should receive 1 dose of Tdap. This initial dose should be followed by tetanus and diphtheria toxoids (Td) booster doses every 10 years. Adults with an unknown or incomplete history of completing a 3-dose immunization series with Td-containing vaccines should begin or complete a primary immunization series including a Tdap dose. Adults should receive a Td booster every 10 years.  Varicella vaccine. An adult without evidence of immunity to varicella should receive 2 doses or a second dose if she has previously received 1 dose.  Pregnant females who do not have evidence of immunity should receive the first dose after pregnancy. This first dose should be obtained before leaving the health care facility. The second dose should be obtained 4 8 weeks after the first dose.  Human papillomavirus (HPV) vaccine. Females aged 43 26 years who have not received the vaccine previously should obtain the 3-dose series. The vaccine is not recommended for use in pregnant females. However, pregnancy testing is not needed before receiving a dose. If a female is found to be pregnant after receiving a dose, no treatment is needed. In that case, the remaining doses should be delayed until after the pregnancy. Immunization is recommended for any person with an immunocompromised condition through the age of 12 years if she did not get any or all doses earlier. During the 3-dose series, the second dose should be obtained 4 8 weeks after the first dose. The third dose should be obtained  24 weeks after the first dose and 16 weeks after the second dose.  Zoster vaccine. One dose is recommended for adults aged 59 years or older unless certain conditions are present.  Measles, mumps, and rubella (MMR) vaccine. Adults born before 60 generally are considered immune to measles and mumps. Adults born in [birth year] or later should have 1 or more doses of MMR vaccine unless there is a contraindication to the vaccine or there is laboratory evidence of immunity to each of the three diseases. A routine second dose of MMR vaccine should be obtained at least 28 days after the first dose for students attending postsecondary schools, health care workers, or international travelers. People who received inactivated measles vaccine or an unknown type of measles vaccine during 1963 1967 should receive 2 doses of MMR vaccine. People who received inactivated mumps vaccine or an unknown type of mumps vaccine before 1979 and are at high risk for mumps infection should consider  immunization with 2 doses of MMR vaccine. For females of childbearing age, rubella immunity should be determined. If there is no evidence of immunity, females who are not pregnant should be vaccinated. If there is no evidence of immunity, females who are pregnant should delay immunization until after pregnancy. Unvaccinated health care workers born before [birth year] who lack laboratory evidence of measles, mumps, or rubella immunity or laboratory confirmation of disease should consider measles and mumps immunization with 2 doses of MMR vaccine or rubella immunization with 1 dose of MMR vaccine.  Pneumococcal 13-valent conjugate (PCV13) vaccine. When indicated, a person who is uncertain of her immunization history and has no record of immunization should receive the PCV13 vaccine. An adult aged 7 years or older who has certain medical conditions and has not been previously immunized should receive 1 dose of PCV13 vaccine. This PCV13 should be followed with a dose of pneumococcal polysaccharide (PPSV23) vaccine. The PPSV23 vaccine dose should be obtained at least 8 weeks after the dose of PCV13 vaccine. An adult aged 19 years or older who has certain medical conditions and previously received 1 or more doses of PPSV23 vaccine should receive 1 dose of PCV13. The PCV13 vaccine dose should be obtained 1 or more years after the last PPSV23 vaccine dose.  Pneumococcal polysaccharide (PPSV23) vaccine. When PCV13 is also indicated, PCV13 should be obtained first. All adults aged 24 years and older should be immunized. An adult younger than age 67 years who has certain medical conditions should be immunized. Any person who resides in a nursing home or long-term care facility should be immunized. An adult smoker should be immunized. People with an immunocompromised condition and certain other conditions should receive both PCV13 and PPSV23 vaccines. People with human immunodeficiency virus (HIV) infection should be immunized as  soon as possible after diagnosis. Immunization during chemotherapy or radiation therapy should be avoided. Routine use of PPSV23 vaccine is not recommended for American Indians, [place] Natives, or people younger than 65 years unless there are medical conditions that require PPSV23 vaccine. When indicated, people who have unknown immunization and have no record of immunization should receive PPSV23 vaccine. One-time revaccination 5 years after the first dose of PPSV23 is recommended for people aged 87 64 years who have chronic kidney failure, nephrotic syndrome, asplenia, or immunocompromised conditions. People who received 1 2 doses of PPSV23 before age 65 years should receive another dose of PPSV23 vaccine at age 50 years or later if at least 5 years have passed since the previous dose. Doses of  PPSV23 are not needed for people immunized with PPSV23 at or after age 50 years.  Meningococcal vaccine. Adults with asplenia or persistent complement component deficiencies should receive 2 doses of quadrivalent meningococcal conjugate (MenACWY-D) vaccine. The doses should be obtained at least 2 months apart. Microbiologists working with certain meningococcal bacteria, [employer] recruits, people at risk during an outbreak, and people who travel to or live in countries with a high rate of meningitis should be immunized. A first-year college student up through age 35 years who is living in a residence hall should receive a dose if she did not receive a dose on or after her 16th birthday. Adults who have certain high-risk conditions should receive one or more doses of vaccine.  Hepatitis A vaccine. Adults who wish to be protected from this disease, have certain high-risk conditions, work with hepatitis A-infected animals, work in hepatitis A research labs, or travel to or work in countries with a high rate of hepatitis A should be immunized. Adults who were previously unvaccinated and who anticipate close contact with an  international adoptee during the first 60 days after arrival in the [place] from a country with a high rate of hepatitis A should be immunized.  Hepatitis B vaccine.  Adults who wish to be protected from this disease, have certain high-risk conditions, may be exposed to blood or other infectious body fluids, are household contacts or sex partners of hepatitis B positive people, are clients or workers in certain care facilities, or travel to or work in countries with a high rate of hepatitis B should be immunized.  Haemophilus influenzae type b (Hib) vaccine. A previously unvaccinated person with asplenia or sickle cell disease or having a scheduled splenectomy should receive 1 dose of Hib vaccine. Regardless of previous immunization, a recipient of a hematopoietic stem cell transplant should receive a 3-dose series 6 12 months after her successful transplant. Hib vaccine is not recommended for adults with HIV infection.  Preventive Services / Frequency Ages 43 to 39years  Blood pressure check.** / Every 1 to 2 years.  Lipid and cholesterol check.** / Every 5 years beginning at age 45.  Clinical breast exam.** / Every 3 years for women in their 53s and 40s.  BRCA-related cancer risk assessment.** / For women who have family members with a BRCA-related cancer (breast, ovarian, tubal, or peritoneal cancers).  Pap test.** / Every 2 years from ages 65 through 6. Every 3 years starting at age 78 through age 43 or 34 with a history of 3 consecutive normal Pap tests.  HPV screening.** / Every 3 years from ages 59 through ages 71 to 71 with a history of 3 consecutive normal Pap tests.  Hepatitis C blood test.** / For any individual with known risks for hepatitis C.  Skin self-exam. / Monthly.  Influenza vaccine. / Every year.  Tetanus, diphtheria, and acellular pertussis (Tdap, Td) vaccine.** / Consult your health care provider. Pregnant women should receive 1 dose of Tdap vaccine during  each pregnancy. 1 dose of Td every 10 years.  Varicella vaccine.** / Consult your health care provider. Pregnant females who do not have evidence of immunity should receive the first dose after pregnancy.  HPV vaccine. / 3 doses over 6 months, if 98 and younger. The vaccine is not recommended for use in pregnant females. However, pregnancy testing is not needed before receiving a dose.  Measles, mumps, rubella (MMR) vaccine.** / You need at least 1 dose of MMR if you were born  in 1957 or later. You may also need a 2nd dose. For females of childbearing age, rubella immunity should be determined. If there is no evidence of immunity, females who are not pregnant should be vaccinated. If there is no evidence of immunity, females who are pregnant should delay immunization until after pregnancy.  Pneumococcal 13-valent conjugate (PCV13) vaccine.** / Consult your health care provider.  Pneumococcal polysaccharide (PPSV23) vaccine.** / 1 to 2 doses if you smoke cigarettes or if you have certain conditions.  Meningococcal vaccine.** / 1 dose if you are age 41 to 29 years and a Market researcher living in a residence hall, or have one of several medical conditions, you need to get vaccinated against meningococcal disease. You may also need additional booster doses.  Hepatitis A vaccine.** / Consult your health care provider.  Hepatitis B vaccine.** / Consult your health care provider.  Haemophilus influenzae type b (Hib) vaccine.** / Consult your health care provider.  Ages 23 to 64years  Blood pressure check.** / Every 1 to 2 years.  Lipid and cholesterol check.** / Every 5 years beginning at age 31 years.  Lung cancer screening. / Every year if you are aged 2 80 years and have a 30-pack-year history of smoking and currently smoke or have quit within the past 15 years. Yearly screening is stopped once you have quit smoking for at least 15 years or develop a health problem that would  prevent you from having lung cancer treatment.  Clinical breast exam.** / Every year after age 66 years.  BRCA-related cancer risk assessment.** / For women who have family members with a BRCA-related cancer (breast, ovarian, tubal, or peritoneal cancers).  Mammogram.** / Every year beginning at age 53 years and continuing for as long as you are in good health. Consult with your health care provider.  Pap test.** / Every 3 years starting at age 39 years through age 32 or 22 years with a history of 3 consecutive normal Pap tests.  HPV screening.** / Every 3 years from ages 64 years through ages 101 to 51 years with a history of 3 consecutive normal Pap tests.  Fecal occult blood test (FOBT) of stool. / Every year beginning at age 34 years and continuing until age 46 years. You may not need to do this test if you get a colonoscopy every 10 years.  Flexible sigmoidoscopy or colonoscopy.** / Every 5 years for a flexible sigmoidoscopy or every 10 years for a colonoscopy beginning at age 53 years and continuing until age 42 years.  Hepatitis C blood test.** / For all people born from [birth year] through 1964 and any individual with known risks for hepatitis C.  Skin self-exam. / Monthly.  Influenza vaccine. / Every year.  Tetanus, diphtheria, and acellular pertussis (Tdap/Td) vaccine.** / Consult your health care provider. Pregnant women should receive 1 dose of Tdap vaccine during each pregnancy. 1 dose of Td every 10 years.  Varicella vaccine.** / Consult your health care provider. Pregnant females who do not have evidence of immunity should receive the first dose after pregnancy.  Zoster vaccine.** / 1 dose for adults aged 74 years or older.  Measles, mumps, rubella (MMR) vaccine.** / You need at least 1 dose of MMR if you were born in 1957 or later. You may also need a 2nd dose. For females of childbearing age, rubella immunity should be determined. If there is no evidence of immunity, females who  are not pregnant should be vaccinated. If there is  no evidence of immunity, females who are pregnant should delay immunization until after pregnancy.  Pneumococcal 13-valent conjugate (PCV13) vaccine.** / Consult your health care provider.  Pneumococcal polysaccharide (PPSV23) vaccine.** / 1 to 2 doses if you smoke cigarettes or if you have certain conditions.  Meningococcal vaccine.** / Consult your health care provider.  Hepatitis A vaccine.** / Consult your health care provider.  Hepatitis B vaccine.** / Consult your health care provider.  Haemophilus influenzae type b (Hib) vaccine.** / Consult your health care provider.  Ages 38 years and over  Blood pressure check.** / Every 1 to 2 years.  Lipid and cholesterol check.** / Every 5 years beginning at age 21 years.  Lung cancer screening. / Every year if you are aged 19 80 years and have a 30-pack-year history of smoking and currently smoke or have quit within the past 15 years. Yearly screening is stopped once you have quit smoking for at least 15 years or develop a health problem that would prevent you from having lung cancer treatment.  Clinical breast exam.** / Every year after age 25 years.  BRCA-related cancer risk assessment.** / For women who have family members with a BRCA-related cancer (breast, ovarian, tubal, or peritoneal cancers).  Mammogram.** / Every year beginning at age 72 years and continuing for as long as you are in good health. Consult with your health care provider.  Pap test.** / Every 3 years starting at age 84 years through age 28 or 57 years with 3 consecutive normal Pap tests. Testing can be stopped between 65 and 70 years with 3 consecutive normal Pap tests and no abnormal Pap or HPV tests in the past 10 years.  HPV screening.** / Every 3 years from ages 16 years through ages 31 or 37 years with a history of 3 consecutive normal Pap tests. Testing can be stopped between 65 and 70 years with 3 consecutive  normal Pap tests and no abnormal Pap or HPV tests in the past 10 years.  Fecal occult blood test (FOBT) of stool. / Every year beginning at age 51 years and continuing until age 39 years. You may not need to do this test if you get a colonoscopy every 10 years.  Flexible sigmoidoscopy or colonoscopy.** / Every 5 years for a flexible sigmoidoscopy or every 10 years for a colonoscopy beginning at age 19 years and continuing until age 79 years.  Hepatitis C blood test.** / For all people born from [birth year] through 1965 and any individual with known risks for hepatitis C.  Osteoporosis screening.** / A one-time screening for women ages 105 years and over and women at risk for fractures or osteoporosis.  Skin self-exam. / Monthly.  Influenza vaccine. / Every year.  Tetanus, diphtheria, and acellular pertussis (Tdap/Td) vaccine.** / 1 dose of Td every 10 years.  Varicella vaccine.** / Consult your health care provider.  Zoster vaccine.** / 1 dose for adults aged 19 years or older.  Pneumococcal 13-valent conjugate (PCV13) vaccine.** / Consult your health care provider.  Pneumococcal polysaccharide (PPSV23) vaccine.** / 1 dose for all adults aged 66 years and older.  Meningococcal vaccine.** / Consult your health care provider.  Hepatitis A vaccine.** / Consult your health care provider.  Hepatitis B vaccine.** / Consult your health care provider.  Haemophilus influenzae type b (Hib) vaccine.** / Consult your health care provider. ** Family history and personal history of risk and conditions may change your health care provider's recommendations. Document Released: 09/02/2001 Document Revised: 04/27/2013  Exit[employer] Patient Information 2014 [employer].   EXERCISE AND DIET:  We recommended that you start or continue a regular exercise program for good health. Regular exercise means any activity that makes your heart beat faster and makes you sweat.  We recommend exercising at least 30  minutes per day at least 3 days a week, preferably 5.  We also recommend a diet low in fat and sugar / carbohydrates.  Inactivity, poor dietary choices and obesity can cause diabetes, heart attack, stroke, and kidney damage, among others.     ALCOHOL AND SMOKING:  Women should limit their alcohol intake to no more than 7 drinks/beers/glasses of wine (combined, not each!) per week. Moderation of alcohol intake to this level decreases your risk of breast cancer and liver damage.  ( And of course, no recreational drugs are part of a healthy lifestyle.)  Also, you should not be smoking at all or even being exposed to second hand smoke. Most people know smoking can cause cancer, and various heart and lung diseases, but did you know it also contributes to weakening of your bones?  Aging of your skin?  Yellowing of your teeth and nails?   CALCIUM AND VITAMIN D:  Adequate intake of calcium and Vitamin D are recommended.  The recommendations for exact amounts of these supplements seem to change often, but generally speaking 600 mg of calcium (either carbonate or citrate) and 800 units of Vitamin D per day seems prudent. Certain women may benefit from higher intake of Vitamin D.  If you are among these women, your doctor will have told you during your visit.     PAP SMEARS:  Pap smears, to check for cervical cancer or precancers,  have traditionally been done yearly, although recent scientific advances have shown that most women can have pap smears less often.  However, every woman still should have a physical exam from her gynecologist or primary care physician every year. It will include a breast check, inspection of the vulva and vagina to check for abnormal growths or skin changes, a visual exam of the cervix, and then an exam to evaluate the size and shape of the uterus and ovaries.  And after 61 years of age, a rectal exam is indicated to check for rectal cancers. We will also provide age appropriate advice  regarding health maintenance, like when you should have certain vaccines, screening for sexually transmitted diseases, bone density testing, colonoscopy, mammograms, etc.    MAMMOGRAMS:  All women over 56 years old should have a yearly mammogram. Many facilities now offer a "3D" mammogram, which may cost around $50 extra out of pocket. If possible,  we recommend you accept the option to have the 3D mammogram performed.  It both reduces the number of women who will be called back for extra views which then turn out to be normal, and it is better than the routine mammogram at detecting truly abnormal areas.     COLONOSCOPY:  Colonoscopy to screen for colon cancer is recommended for all women at age 79.  We know, you hate the idea of the prep.  We agree, BUT, having colon cancer and not knowing it is worse!!  Colon cancer so often starts as a polyp that can be seen and removed at colonscopy, which can quite literally save your life!  And if your first colonoscopy is normal and you have no family history of colon cancer, most women don't have to have it again for 10 years.  Once every ten years, you can do something that may end up saving your life, right?  We will be happy to help you get it scheduled when you are ready.  Be sure to check your insurance coverage so you understand how much it will cost.  It may be covered as a preventative service at no cost, but you should check your particular policy.

## 2018-04-30 LAB — CBC WITH DIFFERENTIAL/PLATELET
Basophils Absolute: 0.1 10*3/uL (ref 0.0–0.2)
Basos: 1 %
EOS (ABSOLUTE): 0.4 10*3/uL (ref 0.0–0.4)
Eos: 4 %
Hematocrit: 42.9 % (ref 34.0–46.6)
Hemoglobin: 14.1 g/dL (ref 11.1–15.9)
Immature Grans (Abs): 0 10*3/uL (ref 0.0–0.1)
Immature Granulocytes: 0 %
Lymphocytes Absolute: 2.1 10*3/uL (ref 0.7–3.1)
Lymphs: 22 %
MCH: 29.8 pg (ref 26.6–33.0)
MCHC: 32.9 g/dL (ref 31.5–35.7)
MCV: 91 fL (ref 79–97)
Monocytes Absolute: 0.7 10*3/uL (ref 0.1–0.9)
Monocytes: 8 %
Neutrophils Absolute: 6.2 10*3/uL (ref 1.4–7.0)
Neutrophils: 65 %
Platelets: 270 10*3/uL (ref 150–450)
RBC: 4.73 x10E6/uL (ref 3.77–5.28)
RDW: 12.1 % — ABNORMAL LOW (ref 12.3–15.4)
WBC: 9.4 10*3/uL (ref 3.4–10.8)

## 2018-04-30 LAB — COMPREHENSIVE METABOLIC PANEL
ALT: 24 IU/L (ref 0–32)
AST: 18 IU/L (ref 0–40)
Albumin/Globulin Ratio: 2.2 (ref 1.2–2.2)
Albumin: 4.3 g/dL (ref 3.6–4.8)
Alkaline Phosphatase: 90 IU/L (ref 39–117)
BUN/Creatinine Ratio: 28 (ref 12–28)
BUN: 22 mg/dL (ref 8–27)
Bilirubin Total: 0.4 mg/dL (ref 0.0–1.2)
CO2: 22 mmol/L (ref 20–29)
Calcium: 9.4 mg/dL (ref 8.7–10.3)
Chloride: 103 mmol/L (ref 96–106)
Creatinine, Ser: 0.79 mg/dL (ref 0.57–1.00)
GFR calc Af Amer: 94 mL/min/{1.73_m2} (ref >59)
GFR calc non Af Amer: 82 mL/min/{1.73_m2} (ref >59)
Globulin, Total: 2 g/dL (ref 1.5–4.5)
Glucose: 91 mg/dL (ref 65–99)
Potassium: 4.3 mmol/L (ref 3.5–5.2)
Sodium: 140 mmol/L (ref 134–144)
Total Protein: 6.3 g/dL (ref 6.0–8.5)

## 2018-04-30 LAB — LIPID PANEL
Chol/HDL Ratio: 2.3 ratio (ref 0.0–4.4)
Cholesterol, Total: 122 mg/dL (ref 100–199)
HDL: 52 mg/dL (ref >39)
LDL Calculated: 43 mg/dL (ref 0–99)
Triglycerides: 135 mg/dL (ref 0–149)
VLDL Cholesterol Cal: 27 mg/dL (ref 5–40)

## 2018-04-30 LAB — VITAMIN D 25 HYDROXY (VIT D DEFICIENCY, FRACTURES): Vit D, 25-Hydroxy: 33.3 ng/mL (ref 30.0–100.0)

## 2018-04-30 LAB — TSH: TSH: 2.94 u[IU]/mL (ref 0.450–4.500)

## 2018-04-30 LAB — T4, FREE: Free T4: 1.15 ng/dL (ref 0.82–1.77)

## 2018-04-30 LAB — HEMOGLOBIN A1C
Est. average glucose Bld gHb Est-mCnc: 117 mg/dL
Hgb A1c MFr Bld: 5.7 % — ABNORMAL HIGH (ref 4.8–5.6)

## 2018-05-09 ENCOUNTER — Other Ambulatory Visit: Payer: Self-pay | Admitting: Family Medicine

## 2018-06-14 DIAGNOSIS — Z01419 Encounter for gynecological examination (general) (routine) without abnormal findings: Secondary | ICD-10-CM | POA: Diagnosis not present

## 2018-06-14 DIAGNOSIS — Z6827 Body mass index (BMI) 27.0-27.9, adult: Secondary | ICD-10-CM | POA: Diagnosis not present

## 2018-06-14 DIAGNOSIS — Z1151 Encounter for screening for human papillomavirus (HPV): Secondary | ICD-10-CM | POA: Diagnosis not present

## 2018-06-14 LAB — HM PAP SMEAR: HM Pap smear: NEGATIVE

## 2018-06-28 ENCOUNTER — Other Ambulatory Visit: Payer: Self-pay | Admitting: Obstetrics and Gynecology

## 2018-06-28 DIAGNOSIS — E2839 Other primary ovarian failure: Secondary | ICD-10-CM

## 2018-08-19 ENCOUNTER — Ambulatory Visit
Admission: RE | Admit: 2018-08-19 | Discharge: 2018-08-19 | Disposition: A | Discharge status: Home / Self Care | Payer: BLUE CROSS/BLUE SHIELD | Source: Ambulatory Visit | Attending: Obstetrics and Gynecology | Admitting: Obstetrics and Gynecology

## 2018-08-19 DIAGNOSIS — Z13820 Encounter for screening for osteoporosis: Secondary | ICD-10-CM | POA: Diagnosis not present

## 2018-08-19 DIAGNOSIS — E2839 Other primary ovarian failure: Secondary | ICD-10-CM

## 2018-08-19 DIAGNOSIS — Z780 Asymptomatic menopausal state: Secondary | ICD-10-CM | POA: Diagnosis not present

## 2018-10-12 ENCOUNTER — Other Ambulatory Visit: Payer: Self-pay

## 2018-10-12 NOTE — Telephone Encounter (Signed)
Patient is requesting refill on Linz[facility] for IBS, medication was las filled by another provider.  LOV 04/29/2018.  Please review and advise. MPulliam, CMA/RT(R)

## 2018-10-13 MED ORDER — LINZESS 72 MCG PO CAPS: 72.0000 ug | ORAL_CAPSULE | Freq: Every day | ORAL | 0 refills | Status: DC

## 2018-10-23 ENCOUNTER — Other Ambulatory Visit: Payer: Self-pay | Admitting: Family Medicine

## 2018-10-23 DIAGNOSIS — G4700 Insomnia, unspecified: Secondary | ICD-10-CM

## 2019-02-24 ENCOUNTER — Other Ambulatory Visit: Payer: Self-pay | Admitting: Family Medicine

## 2019-02-24 DIAGNOSIS — Z1231 Encounter for screening mammogram for malignant neoplasm of breast: Secondary | ICD-10-CM

## 2019-03-24 ENCOUNTER — Telehealth: Payer: Self-pay | Admitting: Family Medicine

## 2019-03-24 NOTE — Telephone Encounter (Signed)
Pt informed to call pharmacy at end of month when she needs RF.  Pt expressed understanding and is agreeable.  Charyl Bigger, CMA

## 2019-03-24 NOTE — Telephone Encounter (Signed)
Patient called states she has just enough Rx till the end of this month & has an Appt w/provider in Oct (but will be out of medication before appt)--- Will Dr. Raliegh Scarlet call her in just enough :  meloxicam (MOBIC) 15 MG tablet [[id number]]   Order Details Dose, Route, Frequency: As Directed  Dispense Quantity: 90 tablet Refills: 1 Fills remaining: --        Sig: TAKE 1 TABLET DAILY     ---  Forwarding request to medical asst that if approved by provider to send refill order for just 30pills to :   [facility] ([address]), [place] - [address] [phone] (Phone) [phone] (Fax)   --glh

## 2019-04-13 ENCOUNTER — Ambulatory Visit
Admission: RE | Admit: 2019-04-13 | Discharge: 2019-04-13 | Disposition: A | Discharge status: Home / Self Care | Payer: BC Managed Care – PPO | Source: Ambulatory Visit | Attending: Family Medicine | Admitting: Family Medicine

## 2019-04-13 ENCOUNTER — Other Ambulatory Visit: Payer: Self-pay

## 2019-04-13 DIAGNOSIS — Z1231 Encounter for screening mammogram for malignant neoplasm of breast: Secondary | ICD-10-CM

## 2019-04-14 ENCOUNTER — Telehealth: Payer: Self-pay | Admitting: Family Medicine

## 2019-04-14 MED ORDER — MELOXICAM 15 MG PO TABS: 15.0000 mg | ORAL_TABLET | Freq: Every day | ORAL | 0 refills | Status: DC

## 2019-04-14 NOTE — Addendum Note (Signed)
Addended by: Fonnie Mu on: 04/14/2019 10:17 AM   Modules accepted: Orders

## 2019-04-14 NOTE — Telephone Encounter (Signed)
Patient states she was advised to contact our office when she was getting low on her meloxicam and we would send a refill prior to her October appt. If approved please send to [facility] on [address] (not [facility] which is her primary pharm)

## 2019-05-12 ENCOUNTER — Other Ambulatory Visit: Payer: Self-pay

## 2019-05-12 ENCOUNTER — Ambulatory Visit (INDEPENDENT_AMBULATORY_CARE_PROVIDER_SITE_OTHER): Payer: BC Managed Care – PPO | Admitting: Family Medicine

## 2019-05-12 ENCOUNTER — Other Ambulatory Visit: Payer: Self-pay | Admitting: Family Medicine

## 2019-05-12 ENCOUNTER — Other Ambulatory Visit: Payer: BC Managed Care – PPO

## 2019-05-12 ENCOUNTER — Encounter: Payer: Self-pay | Admitting: Family Medicine

## 2019-05-12 VITALS — BP 131/85 | HR 66 | Temp 97.7°F | Resp 14 | Ht 68.5 in | Wt 186.2 lb

## 2019-05-12 DIAGNOSIS — Z0000 Encounter for general adult medical examination without abnormal findings: Secondary | ICD-10-CM

## 2019-05-12 DIAGNOSIS — R7302 Impaired glucose tolerance (oral): Secondary | ICD-10-CM | POA: Diagnosis not present

## 2019-05-12 DIAGNOSIS — Z719 Counseling, unspecified: Secondary | ICD-10-CM

## 2019-05-12 DIAGNOSIS — Z79899 Other long term (current) drug therapy: Secondary | ICD-10-CM | POA: Diagnosis not present

## 2019-05-12 DIAGNOSIS — K581 Irritable bowel syndrome with constipation: Secondary | ICD-10-CM | POA: Diagnosis not present

## 2019-05-12 DIAGNOSIS — Z23 Encounter for immunization: Secondary | ICD-10-CM | POA: Diagnosis not present

## 2019-05-12 DIAGNOSIS — Z791 Long term (current) use of non-steroidal anti-inflammatories (NSAID): Secondary | ICD-10-CM

## 2019-05-12 DIAGNOSIS — G4700 Insomnia, unspecified: Secondary | ICD-10-CM

## 2019-05-12 DIAGNOSIS — E559 Vitamin D deficiency, unspecified: Secondary | ICD-10-CM | POA: Diagnosis not present

## 2019-05-12 DIAGNOSIS — K5900 Constipation, unspecified: Secondary | ICD-10-CM | POA: Insufficient documentation

## 2019-05-12 MED ORDER — DOCUSATE SODIUM 100 MG PO CAPS: 100.0000 mg | ORAL_CAPSULE | Freq: Three times a day (TID) | ORAL | 3 refills | Status: DC | PRN

## 2019-05-12 MED ORDER — POLYETHYLENE GLYCOL 3350 17 GM/SCOOP PO POWD: 17.0000 g | Freq: Two times a day (BID) | ORAL | 1 refills | Status: DC | PRN

## 2019-05-12 MED ORDER — MELATONIN 10 MG PO TABS: 1.0000 | ORAL_TABLET | ORAL | Status: DC | PRN

## 2019-05-12 NOTE — Progress Notes (Signed)
Impression and Recommendations:    1. Encounter for wellness examination   2. Health education/counseling   3. Need for Tdap vaccination   4. Need for shingles vaccine   5. Irritable bowel syndrome with constipation   6. Insomnia, unspecified type     Irritable Bowel Syndrome with Constipation - Advised use of Miralax daily along with stool softener if stools tend to be hard. - Prescription Colace and Miralax provided.  - Encouraged patient to drink adequate water and engage in physical activity.  Insomnia - Patient will continue trazodone and ADD melatonin 10 mg over the counter delayed release / sustained release to help her stay asleep through the night.  1) Anticipatory Guidance: Discussed importance of wearing a seatbelt while driving, not texting while driving; sunscreen when outside along with yearly skin surveillance; eating a well balanced and modest diet; physical activity at least 25 minutes per day or 150 min/ week of moderate to intense activity.  - Prudent skin surveillance habits discussed at length with patient.  - Prudent monthly self-breast exam habits discussed with patient today.  2) Immunizations / Screenings / Labs:   All immunizations and screenings that patient agrees to, are up-to-date per recommendations or will be updated today.  Patient understands the needs for q 68mo dental and yearly vision screens which pt will schedule independently. Obtain CBC, CMP, HgA1c, Lipid panel, TSH and vit D when fasting if not already done recently.   - Full fasting lab work obtained today.  - Patient knows to continue to obtain hearing screenings as recommended.  - Colonoscopy up to date, last obtained 2018 with 5 year follow-up.  - Patient will continue to follow up for pap smears with [facility]. - Per patient, tries to go once yearly.  - Pap smear UTD.  Recommended 3-5 year follow-up.  - Mammogram UTD.  - Need for shingles vaccine.   - Obtained flu vaccine at work.  - Discussed sending patient home with stool cards in years between colonoscopy, to screen for blood in the stool.  Stool cards provided today.  3) Weight:   Discussed goal of losing even 5-10% of current body weight which would improve overall feelings of well being and improve objective health data significantly.   Improve nutrient density of diet through increasing intake of fruits and vegetables and decreasing saturated/trans fats, white flour products and refined sugar products.   4) Health Counseling & Preventative Health Maintenance - Advised patient to continue working toward exercising to improve overall mental, physical, and emotional health.    - Reviewed the "spokes of the wheel" of mood and health management.  Stressed the importance of ongoing prudent habits, including regular exercise, appropriate sleep hygiene, healthful dietary habits, and prayer/meditation to relax.  - Encouraged patient to engage in daily physical activity, especially a formal exercise routine.  Recommended that the patient eventually strive for at least 150 minutes of moderate cardiovascular activity per week according to guidelines established by the [facility].   - Healthy dietary habits encouraged, including low-carb, and high amounts of lean protein in diet.   - Patient should also consume adequate amounts of water.  - Health counseling performed.  All questions answered.  Recommendations - Per patient, prefers MyChart message about lab results; will call for OV if she has concerns. - Patient agrees to return for OV if abnormal lab results.    Meds ordered this encounter  Medications  . polyethylene glycol powder (GLYCOLAX/MIRALAX) 17  GM/SCOOP powder    Sig: Take 17 g by mouth 2 (two) times daily as needed.    Dispense:  3350 g    Refill:  1  . docusate sodium (COLACE) 100 MG capsule    Sig: Take 1 capsule (100 mg total) by mouth 3 (three) times daily as needed for  moderate constipation.    Dispense:  90 capsule    Refill:  3  . Melatonin 10 MG TABS    Sig: Take 1 tablet by mouth as needed. For sleep and get the sustained release tablets to help you stay asleep    Dispense:       Orders Placed This Encounter  Procedures  . Tdap vaccine greater than or equal to 7yo IM  . Varicella-zoster vaccine IM (Shingrix)     Return for 64mo for addition of colace and miralax(IBS) and Melatonin added.   Gross side effects, risk and benefits, and alternatives of medications discussed with patient.  Patient is aware that all medications have potential side effects and we are unable to predict every side effect or drug-drug interaction that may occur.  Expresses verbal understanding and consents to current therapy plan and treatment regimen.  F-up preventative CPE in 1 year- reminded pt again, this is in addition to any chronic care visits.    Please see orders placed and AVS handed out to patient at the end of our visit for further patient instructions/ counseling done pertaining to today's office visit.  This document serves as a record of services personally performed by Thomasene Lot, DO. It was created on her behalf by [name], a trained medical scribe. The creation of this record is based on the scribe's personal observations and the provider's statements to them.   I have reviewed the above medical documentation for accuracy and completeness and I concur.  Thomasene Lot, DO 05/16/2019 3:11 PM    Subjective:    Chief Complaint  Patient presents with  . Annual Exam    HPI: Robyn Roberts is a 63 y.o. female who presents to [facility] today a yearly health maintenance exam.  Health Maintenance Summary Reviewed and updated, unless pt declines services.  Colonoscopy:  Last obtained 2018 with 5 year follow-up. Tobacco History Reviewed:  Y; never smoker.  Alcohol:  No concerns, no excessive use. Exercise  Habits:  Not meeting AHA guidelines STD concerns:  None. Drug Use:  None. Birth control method:  Post-menopausal. Menses regular:  Post-menopausal. Lumps or breast concerns:  None reported.   Breast Cancer Family History:  No Bone/ DEXA scan:  Last obtained in January 2020.  Says her husband is doing well.  - Female Health Follows up with OBGYN. Sees Lynden Ang, NP.  Per patient, does perform self-breast exams.  - Eye Health Thinks she had her last true eye exam fifteen years ago.  - Dental Health Thinks she goes every six months.  - Hearing Health She knows she has hearing loss. She has been evaluated in the past. Notes she does have hearing aids, but hates wearing them.  - Dermatological Health She does not follow up with dermatology yearly. Says she's only been to dermatology once in the past. Patient has no concerns about her skin today.  - Constipation States she often has constipation but nothing beyond her norm. She used Linzess for a while, but "I have to be near a bathroom or else I can't take it." Says "I need a good  every day thing I can take."  [facility] concerns, chest pain, SOB, queasiness, diarrhea.  - Sleep She works night shifts and says "I still don't sleep great, but I think that's just how I am."  She continues taking trazodone.  States she's been taking it for so many years.  Notes she has added melatonin to her treatment plan.  "I think it's just me.  Some days are better than others, and sleep has just always been my issue.  I never can shut down sometimes."     Immunization History  Administered Date(s) Administered  . Influenza,inj,Quad PF,6+ Mos 04/20/2017, 04/23/2017  . Influenza-Unspecified 05/05/2016  . Td 07/21/2008  . Tdap 05/12/2019  . Zoster Recombinat (Shingrix) 05/12/2019    Health Maintenance  Topic Date Due  . Hepatitis C Screening  05/11/2020 (Originally [birth year])  . HIV Screening  05/11/2020 (Originally 06/11/1971)   . MAMMOGRAM  04/12/2021  . PAP SMEAR-Modifier  06/14/2021  . COLONOSCOPY  09/23/2026  . TETANUS/TDAP  05/11/2029  . INFLUENZA VACCINE  Completed     Wt Readings from Last 3 Encounters:  05/12/19 186 lb 3.2 oz (84.5 kg)  04/29/18 183 lb 4.8 oz (83.1 kg)  10/29/17 184 lb 3.2 oz (83.6 kg)   BP Readings from Last 3 Encounters:  05/12/19 131/85  04/29/18 124/85  10/29/17 117/76   Pulse Readings from Last 3 Encounters:  05/12/19 66  04/29/18 67  10/29/17 65     Past Medical History:  Diagnosis Date  . Arthritis   . Chronic back pain   . Scoliosis       Past Surgical History:  Procedure Laterality Date  . CESAREAN SECTION  1991  . FOOT SURGERY Bilateral 1985   bunionectomy      Family History  Problem Relation Age of Onset  . Heart disease Mother   . Colon polyps Mother   . Cancer Mother        thyroid  . Asthma Mother   . Cancer Father        colon  . Diabetes Father   . Depression Father   . CVA Father   . Breast cancer Neg Hx       Social History   Substance and Sexual Activity  Drug Use No  ,   Social History   Substance and Sexual Activity  Alcohol Use Yes   Comment: rare  ,   Social History   Tobacco Use  Smoking Status Never Smoker  Smokeless Tobacco Never Used  ,   Social History   Substance and Sexual Activity  Sexual Activity Yes    Current Outpatient Medications on File Prior to Visit  Medication Sig Dispense Refill  . LINZESS 72 MCG capsule Take 1 capsule (72 mcg total) by mouth daily. 90 capsule 0  . loratadine (CLARITIN) 10 MG tablet Take 10 mg by mouth daily.    . MULTIPLE VITAMIN PO Take 1 tablet by mouth daily.    . ranitidine (ZANTAC) 75 MG tablet Take 1 tablet (75 mg total) by mouth 2 (two) times daily. 180 tablet 3   No current facility-administered medications on file prior to visit.     Allergies: Aspirin, Penicillins, and Sulfa antibiotics  Review of Systems: General:   Denies fever, chills,  unexplained weight loss.  Optho/Auditory:   Denies visual changes, blurred vision/LOV Respiratory:   Denies SOB, DOE more than baseline levels.   Cardiovascular:   Denies chest pain, palpitations, new onset peripheral edema  Gastrointestinal:  Denies nausea, vomiting, diarrhea.  Genitourinary: Denies dysuria, freq/ urgency, flank pain or discharge from genitals.  Endocrine:     Denies hot or cold intolerance, polyuria, polydipsia. Musculoskeletal:   Denies unexplained myalgias, joint swelling, unexplained arthralgias, gait problems.  Skin:  Denies rash, suspicious lesions Neurological:     Denies dizziness, unexplained weakness, numbness  Psychiatric/Behavioral:   Denies mood changes, suicidal or homicidal ideations, hallucinations    Objective:    Blood pressure 131/85, pulse 66, temperature 97.7 F (36.5 C), resp. rate 14, height 5' 8.5" (1.74 m), weight 186 lb 3.2 oz (84.5 kg), SpO2 97 %. Body mass index is 27.9 kg/m. General Appearance:    Alert, cooperative, no distress, appears stated age  Head:    Normocephalic, without obvious abnormality, atraumatic  Eyes:    PERRL, conjunctiva/corneas clear, EOM's intact, fundi    benign, both eyes  Ears:    Normal TM's and external ear canals, both ears  Nose:   Nares normal, septum midline, mucosa normal, no drainage    or sinus tenderness  Throat:   Lips w/o lesion, mucosa moist, and tongue normal; teeth and   gums normal  Neck:   Supple, symmetrical, trachea midline, no adenopathy;    thyroid:  no enlargement/tenderness/nodules; no carotid   bruit or JVD  Back:     Symmetric, no curvature, ROM normal, no CVA tenderness  Lungs:     Clear to auscultation bilaterally, respirations unlabored, no       Wh/ R/ R  Chest Wall:    No tenderness or gross deformity; normal excursion   Heart:    Regular rate and rhythm, S1 and S2 normal, no murmur, rub   or gallop  Breast Exam:    No tenderness, masses, or nipple abnormality b/l; no d/c   Abdomen:     Soft, non-tender, bowel sounds active all four quadrants, NO   G/R/R, no masses, no organomegaly  Genitalia:    Deferred to OBGYN.  Rectal:   Deferred to OBGYN.  Extremities:   Extremities normal, atraumatic, no cyanosis or gross edema  Pulses:   2+ and symmetric all extremities  Skin:   Warm, dry, Skin color, texture, turgor normal, no obvious rashes or lesions Psych: No HI/SI, judgement and insight good, Euthymic mood. Full Affect.  Neurologic:   CNII-XII intact, normal strength, sensation and reflexes    Throughout

## 2019-05-12 NOTE — Patient Instructions (Addendum)
Please go to optometrist in near future for full eye exam.  Use melatonin 10 mg over the counter delayed release / sustained release to help you stay asleep through the night.   Preventive Care for Adults, Female  A healthy lifestyle and preventive care can promote health and wellness. Preventive health guidelines for women include the following key practices.   A routine yearly physical is a good way to check with your health care provider about your health and preventive screening. It is a chance to share any concerns and updates on your health and to receive a thorough exam.   Visit your dentist for a routine exam and preventive care every 6 months. Brush your teeth twice a day and floss once a day. Good oral hygiene prevents tooth decay and gum disease.   The frequency of eye exams is based on your age, health, family medical history, use of contact lenses, and other factors. Follow your health care provider's recommendations for frequency of eye exams.   Eat a healthy diet. Foods like vegetables, fruits, whole grains, low-fat dairy products, and lean protein foods contain the nutrients you need without too many calories. Decrease your intake of foods high in solid fats, added sugars, and salt. Eat the right amount of calories for you.Get information about a proper diet from your health care provider, if necessary.   Regular physical exercise is one of the most important things you can do for your health. Most adults should get at least 150 minutes of moderate-intensity exercise (any activity that increases your heart rate and causes you to sweat) each week. In addition, most adults need muscle-strengthening exercises on 2 or more days a week.   Maintain a healthy weight. The body mass index (BMI) is a screening tool to identify possible weight problems. It provides an estimate of body fat based on height and weight. Your health care provider can find your BMI, and can help you achieve  or maintain a healthy weight.For adults 20 years and older:   - A BMI below 18.5 is considered underweight.   - A BMI of 18.5 to 24.9 is normal.   - A BMI of 25 to 29.9 is considered overweight.   - A BMI of 30 and above is considered obese.   Maintain normal blood lipids and cholesterol levels by exercising and minimizing your intake of trans and saturated fats.  Eat a balanced diet with plenty of fruit and vegetables. Blood tests for lipids and cholesterol should begin at age 58 and be repeated every 5 years minimum.  If your lipid or cholesterol levels are high, you are over 40, or you are at high risk for heart disease, you may need your cholesterol levels checked more frequently.Ongoing high lipid and cholesterol levels should be treated with medicines if diet and exercise are not working.   If you smoke, find out from your health care provider how to quit. If you do not use tobacco, do not start.   Lung cancer screening is recommended for adults aged 72-80 years who are at high risk for developing lung cancer because of a history of smoking. A yearly low-dose CT scan of the lungs is recommended for people who have at least a 30-pack-year history of smoking and are a current smoker or have quit within the past 15 years. A pack year of smoking is smoking an average of 1 pack of cigarettes a day for 1 year (for example: 1 pack a day for 30  years or 2 packs a day for 15 years). Yearly screening should continue until the smoker has stopped smoking for at least 15 years. Yearly screening should be stopped for people who develop a health problem that would prevent them from having lung cancer treatment.   If you are pregnant, do not drink alcohol. If you are breastfeeding, be very cautious about drinking alcohol. If you are not pregnant and choose to drink alcohol, do not have more than 1 drink per day. One drink is considered to be 12 ounces (355 mL) of beer, 5 ounces (148 mL) of wine, or 1.5  ounces (44 mL) of liquor.   Avoid use of street drugs. Do not share needles with anyone. Ask for help if you need support or instructions about stopping the use of drugs.   High blood pressure causes heart disease and increases the risk of stroke. Your blood pressure should be checked at least yearly.  Ongoing high blood pressure should be treated with medicines if weight loss and exercise do not work.   If you are 28-41 years old, ask your health care provider if you should take aspirin to prevent strokes.   Diabetes screening involves taking a blood sample to check your fasting blood sugar level. This should be done once every 3 years, after age 74, if you are within normal weight and without risk factors for diabetes. Testing should be considered at a younger age or be carried out more frequently if you are overweight and have at least 1 risk factor for diabetes.   Breast cancer screening is essential preventive care for women. You should practice "breast self-awareness."  This means understanding the normal appearance and feel of your breasts and may include breast self-examination.  Any changes detected, no matter how small, should be reported to a health care provider.  Women in their 34s and 30s should have a clinical breast exam (CBE) by a health care provider as part of a regular health exam every 1 to 3 years.  After age 66, women should have a CBE every year.  Starting at age 32, women should consider having a mammogram (breast X-ray test) every year.  Women who have a family history of breast cancer should talk to their health care provider about genetic screening.  Women at a high risk of breast cancer should talk to their health care providers about having an MRI and a mammogram every year.   -Breast cancer gene (BRCA)-related cancer risk assessment is recommended for women who have family members with BRCA-related cancers. BRCA-related cancers include breast, ovarian, tubal, and  peritoneal cancers. Having family members with these cancers may be associated with an increased risk for harmful changes (mutations) in the breast cancer genes BRCA1 and BRCA2. Results of the assessment will determine the need for genetic counseling and BRCA1 and BRCA2 testing.   The Pap test is a screening test for cervical cancer. A Pap test can show cell changes on the cervix that might become cervical cancer if left untreated. A Pap test is a procedure in which cells are obtained and examined from the lower end of the uterus (cervix).   - Women should have a Pap test starting at age 12.   - Between ages 32 and 26, Pap tests should be repeated every 2 years.   - Beginning at age 40, you should have a Pap test every 3 years as long as the past 3 Pap tests have been normal.   - Some  women have medical problems that increase the chance of getting cervical cancer. Talk to your health care provider about these problems. It is especially important to talk to your health care provider if a new problem develops soon after your last Pap test. In these cases, your health care provider may recommend more frequent screening and Pap tests.   - The above recommendations are the same for women who have or have not gotten the vaccine for human papillomavirus (HPV).   - If you had a hysterectomy for a problem that was not cancer or a condition that could lead to cancer, then you no longer need Pap tests. Even if you no longer need a Pap test, a regular exam is a good idea to make sure no other problems are starting.   - If you are between ages 51 and 24 years, and you have had normal Pap tests going back 10 years, you no longer need Pap tests. Even if you no longer need a Pap test, a regular exam is a good idea to make sure no other problems are starting.   - If you have had past treatment for cervical cancer or a condition that could lead to cancer, you need Pap tests and screening for cancer for at least 20  years after your treatment.   - If Pap tests have been discontinued, risk factors (such as a new sexual partner) need to be reassessed to determine if screening should be resumed.   - The HPV test is an additional test that may be used for cervical cancer screening. The HPV test looks for the virus that can cause the cell changes on the cervix. The cells collected during the Pap test can be tested for HPV. The HPV test could be used to screen women aged 75 years and older, and should be used in women of any age who have unclear Pap test results. After the age of 38, women should have HPV testing at the same frequency as a Pap test.   Colorectal cancer can be detected and often prevented. Most routine colorectal cancer screening begins at the age of 4 years and continues through age 48 years. However, your health care provider may recommend screening at an earlier age if you have risk factors for colon cancer. On a yearly basis, your health care provider may provide home test kits to check for hidden blood in the stool.  Use of a small camera at the end of a tube, to directly examine the colon (sigmoidoscopy or colonoscopy), can detect the earliest forms of colorectal cancer. Talk to your health care provider about this at age 18, when routine screening begins. Direct exam of the colon should be repeated every 5 -10 years through age 74 years, unless early forms of pre-cancerous polyps or small growths are found.   People who are at an increased risk for hepatitis B should be screened for this virus. You are considered at high risk for hepatitis B if:  -You were born in a country where hepatitis B occurs often. Talk with your health care provider about which countries are considered high risk.  - Your parents were born in a high-risk country and you have not received a shot to protect against hepatitis B (hepatitis B vaccine).  - You have HIV or AIDS.  - You use needles to inject street drugs.  - You  live with, or have sex with, someone who has Hepatitis B.  - You get hemodialysis treatment.  -  You take certain medicines for conditions like cancer, organ transplantation, and autoimmune conditions.   Hepatitis C blood testing is recommended for all people born from [birth year] through 1965 and any individual with known risks for hepatitis C.   Practice safe sex. Use condoms and avoid high-risk sexual practices to reduce the spread of sexually transmitted infections (STIs). STIs include gonorrhea, chlamydia, syphilis, trichomonas, herpes, HPV, and human immunodeficiency virus (HIV). Herpes, HIV, and HPV are viral illnesses that have no cure. They can result in disability, cancer, and death. Sexually active women aged 52 years and younger should be checked for chlamydia. Older women with new or multiple partners should also be tested for chlamydia. Testing for other STIs is recommended if you are sexually active and at increased risk.   Osteoporosis is a disease in which the bones lose minerals and strength with aging. This can result in serious bone fractures or breaks. The risk of osteoporosis can be identified using a bone density scan. Women ages 30 years and over and women at risk for fractures or osteoporosis should discuss screening with their health care providers. Ask your health care provider whether you should take a calcium supplement or vitamin D to There are also several preventive steps women can take to avoid osteoporosis and resulting fractures or to keep osteoporosis from worsening. -->Recommendations include:  Eat a balanced diet high in fruits, vegetables, calcium, and vitamins.  Get enough calcium. The recommended total intake of is 1,200 mg daily; for best absorption, if taking supplements, divide doses into 250-500 mg doses throughout the day. Of the two types of calcium, calcium carbonate is best absorbed when taken with food but calcium citrate can be taken on an empty  stomach.  Get enough vitamin D. N[employer] and the [employer] recommend at least 1,000 IU per day for women age 68 and over who are at risk of vitamin D deficiency. Vitamin D deficiency can be caused by inadequate sun exposure (for example, those who live in [place]).  Avoid alcohol and smoking. Heavy alcohol intake (more than 7 drinks per week) increases the risk of falls and hip fracture and women smokers tend to lose bone more rapidly and have lower bone mass than nonsmokers. Stopping smoking is one of the most important changes women can make to improve their health and decrease risk for disease.  Be physically active every day. Weight-bearing exercise (for example, fast walking, hiking, jogging, and weight training) may strengthen bones or slow the rate of bone loss that comes with aging. Balancing and muscle-strengthening exercises can reduce the risk of falling and fracture.  Consider therapeutic medications. Currently, several types of effective drugs are available. Healthcare providers can recommend the type most appropriate for each woman.  Eliminate environmental factors that may contribute to accidents. Falls cause nearly 90% of all osteoporotic fractures, so reducing this risk is an important bone-health strategy. Measures include ample lighting, removing obstructions to walking, using nonskid rugs on floors, and placing mats and/or grab bars in showers.  Be aware of medication side effects. Some common medicines make bones weaker. These include a type of steroid drug called glucocorticoids used for arthritis and asthma, some antiseizure drugs, certain sleeping pills, treatments for endometriosis, and some cancer drugs. An overactive thyroid gland or using too much thyroid hormone for an underactive thyroid can also be a problem. If you are taking these medicines, talk to your doctor about what you can do to help protect your bones.reduce the rate of  osteoporosis.    Menopause can be associated with physical symptoms and risks. Hormone replacement therapy is available to decrease symptoms and risks. You should talk to your health care provider about whether hormone replacement therapy is right for you.   Use sunscreen. Apply sunscreen liberally and repeatedly throughout the day. You should seek shade when your shadow is shorter than you. Protect yourself by wearing long sleeves, pants, a wide-brimmed hat, and sunglasses year round, whenever you are outdoors.   Once a month, do a whole body skin exam, using a mirror to look at the skin on your back. Tell your health care provider of new moles, moles that have irregular borders, moles that are larger than a pencil eraser, or moles that have changed in shape or color.   -Stay current with required vaccines (immunizations).   Influenza vaccine. All adults should be immunized every year.  Tetanus, diphtheria, and acellular pertussis (Td, Tdap) vaccine. Pregnant women should receive 1 dose of Tdap vaccine during each pregnancy. The dose should be obtained regardless of the length of time since the last dose. Immunization is preferred during the 27th 36th week of gestation. An adult who has not previously received Tdap or who does not know her vaccine status should receive 1 dose of Tdap. This initial dose should be followed by tetanus and diphtheria toxoids (Td) booster doses every 10 years. Adults with an unknown or incomplete history of completing a 3-dose immunization series with Td-containing vaccines should begin or complete a primary immunization series including a Tdap dose. Adults should receive a Td booster every 10 years.  Varicella vaccine. An adult without evidence of immunity to varicella should receive 2 doses or a second dose if she has previously received 1 dose. Pregnant females who do not have evidence of immunity should receive the first dose after pregnancy. This first dose  should be obtained before leaving the health care facility. The second dose should be obtained 4 8 weeks after the first dose.  Human papillomavirus (HPV) vaccine. Females aged 55 26 years who have not received the vaccine previously should obtain the 3-dose series. The vaccine is not recommended for use in pregnant females. However, pregnancy testing is not needed before receiving a dose. If a female is found to be pregnant after receiving a dose, no treatment is needed. In that case, the remaining doses should be delayed until after the pregnancy. Immunization is recommended for any person with an immunocompromised condition through the age of 56 years if she did not get any or all doses earlier. During the 3-dose series, the second dose should be obtained 4 8 weeks after the first dose. The third dose should be obtained 24 weeks after the first dose and 16 weeks after the second dose.  Zoster vaccine. One dose is recommended for adults aged 29 years or older unless certain conditions are present.  Measles, mumps, and rubella (MMR) vaccine. Adults born before [birth year] generally are considered immune to measles and mumps. Adults born in [birth year] or later should have 1 or more doses of MMR vaccine unless there is a contraindication to the vaccine or there is laboratory evidence of immunity to each of the three diseases. A routine second dose of MMR vaccine should be obtained at least 28 days after the first dose for students attending postsecondary schools, health care workers, or international travelers. People who received inactivated measles vaccine or an unknown type of measles vaccine during 1963 1967 should receive 2 doses of MMR  vaccine. People who received inactivated mumps vaccine or an unknown type of mumps vaccine before 1979 and are at high risk for mumps infection should consider immunization with 2 doses of MMR vaccine. For females of childbearing age, rubella immunity should be determined. If there is  no evidence of immunity, females who are not pregnant should be vaccinated. If there is no evidence of immunity, females who are pregnant should delay immunization until after pregnancy. Unvaccinated health care workers born before [birth year] who lack laboratory evidence of measles, mumps, or rubella immunity or laboratory confirmation of disease should consider measles and mumps immunization with 2 doses of MMR vaccine or rubella immunization with 1 dose of MMR vaccine.  Pneumococcal 13-valent conjugate (PCV13) vaccine. When indicated, a person who is uncertain of her immunization history and has no record of immunization should receive the PCV13 vaccine. An adult aged 39 years or older who has certain medical conditions and has not been previously immunized should receive 1 dose of PCV13 vaccine. This PCV13 should be followed with a dose of pneumococcal polysaccharide (PPSV23) vaccine. The PPSV23 vaccine dose should be obtained at least 8 weeks after the dose of PCV13 vaccine. An adult aged 16 years or older who has certain medical conditions and previously received 1 or more doses of PPSV23 vaccine should receive 1 dose of PCV13. The PCV13 vaccine dose should be obtained 1 or more years after the last PPSV23 vaccine dose.  Pneumococcal polysaccharide (PPSV23) vaccine. When PCV13 is also indicated, PCV13 should be obtained first. All adults aged 68 years and older should be immunized. An adult younger than age 75 years who has certain medical conditions should be immunized. Any person who resides in a nursing home or long-term care facility should be immunized. An adult smoker should be immunized. People with an immunocompromised condition and certain other conditions should receive both PCV13 and PPSV23 vaccines. People with human immunodeficiency virus (HIV) infection should be immunized as soon as possible after diagnosis. Immunization during chemotherapy or radiation therapy should be avoided. Routine use of  PPSV23 vaccine is not recommended for American Indians, [place] Natives, or people younger than 65 years unless there are medical conditions that require PPSV23 vaccine. When indicated, people who have unknown immunization and have no record of immunization should receive PPSV23 vaccine. One-time revaccination 5 years after the first dose of PPSV23 is recommended for people aged 76 64 years who have chronic kidney failure, nephrotic syndrome, asplenia, or immunocompromised conditions. People who received 1 2 doses of PPSV23 before age 14 years should receive another dose of PPSV23 vaccine at age 76 years or later if at least 5 years have passed since the previous dose. Doses of PPSV23 are not needed for people immunized with PPSV23 at or after age 77 years.  Meningococcal vaccine. Adults with asplenia or persistent complement component deficiencies should receive 2 doses of quadrivalent meningococcal conjugate (MenACWY-D) vaccine. The doses should be obtained at least 2 months apart. Microbiologists working with certain meningococcal bacteria, [employer] recruits, people at risk during an outbreak, and people who travel to or live in countries with a high rate of meningitis should be immunized. A first-year college student up through age 69 years who is living in a residence hall should receive a dose if she did not receive a dose on or after her 16th birthday. Adults who have certain high-risk conditions should receive one or more doses of vaccine.  Hepatitis A vaccine. Adults who wish to be protected from this disease,  have certain high-risk conditions, work with hepatitis A-infected animals, work in hepatitis A research labs, or travel to or work in countries with a high rate of hepatitis A should be immunized. Adults who were previously unvaccinated and who anticipate close contact with an international adoptee during the first 60 days after arrival in the [place] from a country with a high rate of  hepatitis A should be immunized.  Hepatitis B vaccine.  Adults who wish to be protected from this disease, have certain high-risk conditions, may be exposed to blood or other infectious body fluids, are household contacts or sex partners of hepatitis B positive people, are clients or workers in certain care facilities, or travel to or work in countries with a high rate of hepatitis B should be immunized.  Haemophilus influenzae type b (Hib) vaccine. A previously unvaccinated person with asplenia or sickle cell disease or having a scheduled splenectomy should receive 1 dose of Hib vaccine. Regardless of previous immunization, a recipient of a hematopoietic stem cell transplant should receive a 3-dose series 6 12 months after her successful transplant. Hib vaccine is not recommended for adults with HIV infection.  Preventive Services / Frequency Ages 23 to 39years  Blood pressure check.** / Every 1 to 2 years.  Lipid and cholesterol check.** / Every 5 years beginning at age 69.  Clinical breast exam.** / Every 3 years for women in their 25s and 34s.  BRCA-related cancer risk assessment.** / For women who have family members with a BRCA-related cancer (breast, ovarian, tubal, or peritoneal cancers).  Pap test.** / Every 2 years from ages 67 through 12. Every 3 years starting at age 42 through age 81 or 74 with a history of 3 consecutive normal Pap tests.  HPV screening.** / Every 3 years from ages 53 through ages 60 to 57 with a history of 3 consecutive normal Pap tests.  Hepatitis C blood test.** / For any individual with known risks for hepatitis C.  Skin self-exam. / Monthly.  Influenza vaccine. / Every year.  Tetanus, diphtheria, and acellular pertussis (Tdap, Td) vaccine.** / Consult your health care provider. Pregnant women should receive 1 dose of Tdap vaccine during each pregnancy. 1 dose of Td every 10 years.  Varicella vaccine.** / Consult your health care provider. Pregnant  females who do not have evidence of immunity should receive the first dose after pregnancy.  HPV vaccine. / 3 doses over 6 months, if 58 and younger. The vaccine is not recommended for use in pregnant females. However, pregnancy testing is not needed before receiving a dose.  Measles, mumps, rubella (MMR) vaccine.** / You need at least 1 dose of MMR if you were born in 1957 or later. You may also need a 2nd dose. For females of childbearing age, rubella immunity should be determined. If there is no evidence of immunity, females who are not pregnant should be vaccinated. If there is no evidence of immunity, females who are pregnant should delay immunization until after pregnancy.  Pneumococcal 13-valent conjugate (PCV13) vaccine.** / Consult your health care provider.  Pneumococcal polysaccharide (PPSV23) vaccine.** / 1 to 2 doses if you smoke cigarettes or if you have certain conditions.  Meningococcal vaccine.** / 1 dose if you are age 45 to 75 years and a Market researcher living in a residence hall, or have one of several medical conditions, you need to get vaccinated against meningococcal disease. You may also need additional booster doses.  Hepatitis A vaccine.** / Consult your  health care provider.  Hepatitis B vaccine.** / Consult your health care provider.  Haemophilus influenzae type b (Hib) vaccine.** / Consult your health care provider.  Ages 13 to 64years  Blood pressure check.** / Every 1 to 2 years.  Lipid and cholesterol check.** / Every 5 years beginning at age 72 years.  Lung cancer screening. / Every year if you are aged 69 80 years and have a 30-pack-year history of smoking and currently smoke or have quit within the past 15 years. Yearly screening is stopped once you have quit smoking for at least 15 years or develop a health problem that would prevent you from having lung cancer treatment.  Clinical breast exam.** / Every year after age 36  years.  BRCA-related cancer risk assessment.** / For women who have family members with a BRCA-related cancer (breast, ovarian, tubal, or peritoneal cancers).  Mammogram.** / Every year beginning at age 21 years and continuing for as long as you are in good health. Consult with your health care provider.  Pap test.** / Every 3 years starting at age 75 years through age 73 or 72 years with a history of 3 consecutive normal Pap tests.  HPV screening.** / Every 3 years from ages 27 years through ages 45 to 74 years with a history of 3 consecutive normal Pap tests.  Fecal occult blood test (FOBT) of stool. / Every year beginning at age 62 years and continuing until age 33 years. You may not need to do this test if you get a colonoscopy every 10 years.  Flexible sigmoidoscopy or colonoscopy.** / Every 5 years for a flexible sigmoidoscopy or every 10 years for a colonoscopy beginning at age 59 years and continuing until age 34 years.  Hepatitis C blood test.** / For all people born from [birth year] through 1964 and any individual with known risks for hepatitis C.  Skin self-exam. / Monthly.  Influenza vaccine. / Every year.  Tetanus, diphtheria, and acellular pertussis (Tdap/Td) vaccine.** / Consult your health care provider. Pregnant women should receive 1 dose of Tdap vaccine during each pregnancy. 1 dose of Td every 10 years.  Varicella vaccine.** / Consult your health care provider. Pregnant females who do not have evidence of immunity should receive the first dose after pregnancy.  Zoster vaccine.** / 1 dose for adults aged 52 years or older.  Measles, mumps, rubella (MMR) vaccine.** / You need at least 1 dose of MMR if you were born in 1957 or later. You may also need a 2nd dose. For females of childbearing age, rubella immunity should be determined. If there is no evidence of immunity, females who are not pregnant should be vaccinated. If there is no evidence of immunity, females who are  pregnant should delay immunization until after pregnancy.  Pneumococcal 13-valent conjugate (PCV13) vaccine.** / Consult your health care provider.  Pneumococcal polysaccharide (PPSV23) vaccine.** / 1 to 2 doses if you smoke cigarettes or if you have certain conditions.  Meningococcal vaccine.** / Consult your health care provider.  Hepatitis A vaccine.** / Consult your health care provider.  Hepatitis B vaccine.** / Consult your health care provider.  Haemophilus influenzae type b (Hib) vaccine.** / Consult your health care provider.  Ages 60 years and over  Blood pressure check.** / Every 1 to 2 years.  Lipid and cholesterol check.** / Every 5 years beginning at age 48 years.  Lung cancer screening. / Every year if you are aged 43 80 years and have a 30-pack-year history of  smoking and currently smoke or have quit within the past 15 years. Yearly screening is stopped once you have quit smoking for at least 15 years or develop a health problem that would prevent you from having lung cancer treatment.  Clinical breast exam.** / Every year after age 15 years.  BRCA-related cancer risk assessment.** / For women who have family members with a BRCA-related cancer (breast, ovarian, tubal, or peritoneal cancers).  Mammogram.** / Every year beginning at age 17 years and continuing for as long as you are in good health. Consult with your health care provider.  Pap test.** / Every 3 years starting at age 58 years through age 39 or 66 years with 3 consecutive normal Pap tests. Testing can be stopped between 65 and 70 years with 3 consecutive normal Pap tests and no abnormal Pap or HPV tests in the past 10 years.  HPV screening.** / Every 3 years from ages 76 years through ages 7 or 45 years with a history of 3 consecutive normal Pap tests. Testing can be stopped between 65 and 70 years with 3 consecutive normal Pap tests and no abnormal Pap or HPV tests in the past 10 years.  Fecal occult  blood test (FOBT) of stool. / Every year beginning at age 65 years and continuing until age 20 years. You may not need to do this test if you get a colonoscopy every 10 years.  Flexible sigmoidoscopy or colonoscopy.** / Every 5 years for a flexible sigmoidoscopy or every 10 years for a colonoscopy beginning at age 47 years and continuing until age 86 years.  Hepatitis C blood test.** / For all people born from [birth year] through 1964 and any individual with known risks for hepatitis C.  Osteoporosis screening.** / A one-time screening for women ages 44 years and over and women at risk for fractures or osteoporosis.  Skin self-exam. / Monthly.  Influenza vaccine. / Every year.  Tetanus, diphtheria, and acellular pertussis (Tdap/Td) vaccine.** / 1 dose of Td every 10 years.  Varicella vaccine.** / Consult your health care provider.  Zoster vaccine.** / 1 dose for adults aged 69 years or older.  Pneumococcal 13-valent conjugate (PCV13) vaccine.** / Consult your health care provider.  Pneumococcal polysaccharide (PPSV23) vaccine.** / 1 dose for all adults aged 49 years and older.  Meningococcal vaccine.** / Consult your health care provider.  Hepatitis A vaccine.** / Consult your health care provider.  Hepatitis B vaccine.** / Consult your health care provider.  Haemophilus influenzae type b (Hib) vaccine.** / Consult your health care provider. ** Family history and personal history of risk and conditions may change your health care provider's recommendations. Document Released: 09/02/2001 Document Revised: 04/27/2013  [facility] Patient Information 2014 [place], [place].   EXERCISE AND DIET:  We recommended that you start or continue a regular exercise program for good health. Regular exercise means any activity that makes your heart beat faster and makes you sweat.  We recommend exercising at least 30 minutes per day at least 3 days a week, preferably 5.  We also recommend a diet low in fat  and sugar / carbohydrates.  Inactivity, poor dietary choices and obesity can cause diabetes, heart attack, stroke, and kidney damage, among others.     ALCOHOL AND SMOKING:  Women should limit their alcohol intake to no more than 7 drinks/beers/glasses of wine (combined, not each!) per week. Moderation of alcohol intake to this level decreases your risk of breast cancer and liver damage.  ( And of  course, no recreational drugs are part of a healthy lifestyle.)  Also, you should not be smoking at all or even being exposed to second hand smoke. Most people know smoking can cause cancer, and various heart and lung diseases, but did you know it also contributes to weakening of your bones?  Aging of your skin?  Yellowing of your teeth and nails?   CALCIUM AND VITAMIN D:  Adequate intake of calcium and Vitamin D are recommended.  The recommendations for exact amounts of these supplements seem to change often, but generally speaking 600 mg of calcium (either carbonate or citrate) and 800 units of Vitamin D per day seems prudent. Certain women may benefit from higher intake of Vitamin D.  If you are among these women, your doctor will have told you during your visit.     PAP SMEARS:  Pap smears, to check for cervical cancer or precancers,  have traditionally been done yearly, although recent scientific advances have shown that most women can have pap smears less often.  However, every woman still should have a physical exam from her gynecologist or primary care physician every year. It will include a breast check, inspection of the vulva and vagina to check for abnormal growths or skin changes, a visual exam of the cervix, and then an exam to evaluate the size and shape of the uterus and ovaries.  And after 62 years of age, a rectal exam is indicated to check for rectal cancers. We will also provide age appropriate advice regarding health maintenance, like when you should have certain vaccines, screening for  sexually transmitted diseases, bone density testing, colonoscopy, mammograms, etc.    MAMMOGRAMS:  All women over 37 years old should have a yearly mammogram. Many facilities now offer a "3D" mammogram, which may cost around $50 extra out of pocket. If possible,  we recommend you accept the option to have the 3D mammogram performed.  It both reduces the number of women who will be called back for extra views which then turn out to be normal, and it is better than the routine mammogram at detecting truly abnormal areas.     COLONOSCOPY:  Colonoscopy to screen for colon cancer is recommended for all women at age 25.  We know, you hate the idea of the prep.  We agree, BUT, having colon cancer and not knowing it is worse!!  Colon cancer so often starts as a polyp that can be seen and removed at colonscopy, which can quite literally save your life!  And if your first colonoscopy is normal and you have no family history of colon cancer, most women don't have to have it again for 10 years.  Once every ten years, you can do something that may end up saving your life, right?  We will be happy to help you get it scheduled when you are ready.  Be sure to check your insurance coverage so you understand how much it will cost.  It may be covered as a preventative service at no cost, but you should check your particular policy.

## 2019-05-13 LAB — CBC WITH DIFFERENTIAL/PLATELET
Basophils Absolute: 0.1 10*3/uL (ref 0.0–0.2)
Basos: 1 %
EOS (ABSOLUTE): 0.4 10*3/uL (ref 0.0–0.4)
Eos: 4 %
Hematocrit: 43.7 % (ref 34.0–46.6)
Hemoglobin: 14.5 g/dL (ref 11.1–15.9)
Immature Grans (Abs): 0 10*3/uL (ref 0.0–0.1)
Immature Granulocytes: 0 %
Lymphocytes Absolute: 2 10*3/uL (ref 0.7–3.1)
Lymphs: 23 %
MCH: 30.1 pg (ref 26.6–33.0)
MCHC: 33.2 g/dL (ref 31.5–35.7)
MCV: 91 fL (ref 79–97)
Monocytes Absolute: 0.7 10*3/uL (ref 0.1–0.9)
Monocytes: 8 %
Neutrophils Absolute: 5.7 10*3/uL (ref 1.4–7.0)
Neutrophils: 64 %
Platelets: 260 10*3/uL (ref 150–450)
RBC: 4.82 x10E6/uL (ref 3.77–5.28)
RDW: 12.4 % (ref 11.7–15.4)
WBC: 8.9 10*3/uL (ref 3.4–10.8)

## 2019-05-13 LAB — LIPID PANEL
Chol/HDL Ratio: 2.5 ratio (ref 0.0–4.4)
Cholesterol, Total: 136 mg/dL (ref 100–199)
HDL: 54 mg/dL (ref >39)
LDL Chol Calc (NIH): 55 mg/dL (ref 0–99)
Triglycerides: 165 mg/dL — ABNORMAL HIGH (ref 0–149)
VLDL Cholesterol Cal: 27 mg/dL (ref 5–40)

## 2019-05-13 LAB — COMPREHENSIVE METABOLIC PANEL
ALT: 31 IU/L (ref 0–32)
AST: 22 IU/L (ref 0–40)
Albumin/Globulin Ratio: 2.1 (ref 1.2–2.2)
Albumin: 4.6 g/dL (ref 3.8–4.8)
Alkaline Phosphatase: 100 IU/L (ref 39–117)
BUN/Creatinine Ratio: 24 (ref 12–28)
BUN: 20 mg/dL (ref 8–27)
Bilirubin Total: 0.5 mg/dL (ref 0.0–1.2)
CO2: 23 mmol/L (ref 20–29)
Calcium: 9.4 mg/dL (ref 8.7–10.3)
Chloride: 104 mmol/L (ref 96–106)
Creatinine, Ser: 0.83 mg/dL (ref 0.57–1.00)
GFR calc Af Amer: 88 mL/min/{1.73_m2} (ref >59)
GFR calc non Af Amer: 76 mL/min/{1.73_m2} (ref >59)
Globulin, Total: 2.2 g/dL (ref 1.5–4.5)
Glucose: 91 mg/dL (ref 65–99)
Potassium: 4.8 mmol/L (ref 3.5–5.2)
Sodium: 144 mmol/L (ref 134–144)
Total Protein: 6.8 g/dL (ref 6.0–8.5)

## 2019-05-13 LAB — VITAMIN D 25 HYDROXY (VIT D DEFICIENCY, FRACTURES): Vit D, 25-Hydroxy: 41.2 ng/mL (ref 30.0–100.0)

## 2019-05-13 LAB — HEMOGLOBIN A1C
Est. average glucose Bld gHb Est-mCnc: 126 mg/dL
Hgb A1c MFr Bld: 6 % — ABNORMAL HIGH (ref 4.8–5.6)

## 2019-05-13 LAB — T3: T3, Total: 132 ng/dL (ref 71–180)

## 2019-05-13 LAB — TSH: TSH: 2.79 u[IU]/mL (ref 0.450–4.500)

## 2019-05-13 LAB — T4, FREE: Free T4: 1.28 ng/dL (ref 0.82–1.77)

## 2019-07-11 DIAGNOSIS — R05 Cough: Secondary | ICD-10-CM | POA: Diagnosis not present

## 2019-07-11 DIAGNOSIS — Z20828 Contact with and (suspected) exposure to other viral communicable diseases: Secondary | ICD-10-CM | POA: Diagnosis not present

## 2019-07-11 DIAGNOSIS — R519 Headache, unspecified: Secondary | ICD-10-CM | POA: Diagnosis not present

## 2019-08-01 ENCOUNTER — Ambulatory Visit: Payer: BC Managed Care – PPO | Admitting: Family Medicine

## 2019-08-10 ENCOUNTER — Other Ambulatory Visit: Payer: Self-pay | Admitting: Family Medicine

## 2019-09-19 ENCOUNTER — Other Ambulatory Visit: Payer: Self-pay | Admitting: Family Medicine

## 2019-09-19 MED ORDER — MELOXICAM 15 MG PO TABS: 15.0000 mg | ORAL_TABLET | Freq: Every day | ORAL | 0 refills | Status: DC

## 2019-11-15 ENCOUNTER — Encounter: Payer: Self-pay | Admitting: Family Medicine

## 2019-11-15 ENCOUNTER — Other Ambulatory Visit: Payer: Self-pay

## 2019-11-15 ENCOUNTER — Ambulatory Visit (INDEPENDENT_AMBULATORY_CARE_PROVIDER_SITE_OTHER): Payer: BC Managed Care – PPO | Admitting: Family Medicine

## 2019-11-15 VITALS — BP 119/72 | HR 84 | Ht 68.5 in | Wt 177.0 lb

## 2019-11-15 DIAGNOSIS — M159 Polyosteoarthritis, unspecified: Secondary | ICD-10-CM | POA: Diagnosis not present

## 2019-11-15 DIAGNOSIS — J3089 Other allergic rhinitis: Secondary | ICD-10-CM

## 2019-11-15 DIAGNOSIS — K219 Gastro-esophageal reflux disease without esophagitis: Secondary | ICD-10-CM | POA: Diagnosis not present

## 2019-11-15 DIAGNOSIS — K581 Irritable bowel syndrome with constipation: Secondary | ICD-10-CM | POA: Diagnosis not present

## 2019-11-15 DIAGNOSIS — G4700 Insomnia, unspecified: Secondary | ICD-10-CM | POA: Diagnosis not present

## 2019-11-15 DIAGNOSIS — Z791 Long term (current) use of non-steroidal anti-inflammatories (NSAID): Secondary | ICD-10-CM

## 2019-11-15 DIAGNOSIS — H1013 Acute atopic conjunctivitis, bilateral: Secondary | ICD-10-CM

## 2019-11-15 MED ORDER — MELOXICAM 15 MG PO TABS: 15.0000 mg | ORAL_TABLET | Freq: Every day | ORAL | 1 refills | Status: DC

## 2019-11-15 MED ORDER — POLYETHYLENE GLYCOL 3350 17 GM/SCOOP PO POWD: 17.0000 g | Freq: Two times a day (BID) | ORAL | 1 refills | Status: AC | PRN

## 2019-11-15 MED ORDER — TRAZODONE HCL 100 MG PO TABS: ORAL_TABLET | ORAL | 1 refills | Status: DC

## 2019-11-15 MED ORDER — OLOPATADINE HCL 0.1 % OP SOLN: 1.0000 [drp] | Freq: Two times a day (BID) | OPHTHALMIC | 5 refills | Status: DC

## 2019-11-15 MED ORDER — FAMOTIDINE 20 MG PO TABS: 20.0000 mg | ORAL_TABLET | Freq: Every day | ORAL | 1 refills | Status: DC

## 2019-11-15 NOTE — Progress Notes (Signed)
Telehealth office visit note for Robyn Roberts, D.O- at Primary Care at [facility]   I connected with current patient today and verified that I am speaking with the correct person   . Location of the patient: Home . Location of the provider: Office - This visit type was conducted due to national recommendations for restrictions regarding the COVID-19 Pandemic (e.g. social distancing) in an effort to limit this patient's exposure and mitigate transmission in our community.    - No physical exam could be performed with this format, beyond that communicated to [place] by the patient/ family members as noted.   - Additionally my office staff/ schedulers were to discuss with the patient that there may be a monetary charge related to this service, depending on their medical insurance.  My understanding is that patient understood and consented to proceed.     _________________________________________________________________________________   History of Present Illness:   I, Robyn Roberts, am serving as Neurosurgeon for [employer].  Patient says she's doing good today, "doing okay, just trying to make it" in terms of COVID-19.  She has received both doses of the Moderna vaccination.  - Digestive Concerns She is taking Miralax and notes "it seems to do it the best for me."  She is no longer taking Linzess; notes "I'd take it and I couldn't leave the house" for fear of not being near a bathroom.  She is no longer taking Colace.  - GERD Feels her reflux is controlled; "I don't have that anymore."   She is no longer taking Zantac for reflux.  - Use of Mobic She continues to use meloxicam daily.  - Sleep She continues using melatonin and trazodone nightly.  Feels her sleep is "better, it's good."  Feels the medications do help.  She takes one and a half tablets of trazodone, and 10 mg of melatonin.  - Environmental & Seasonal Allrgies Says that her allergies have been fine.  Note  "this time of year," her eye bothers her, and she ends up sneezing, but that allergy medications do help.     No flowsheet data found.  Depression screen [facility] 2/9 11/15/2019 05/12/2019 04/29/2018 10/29/2017 04/20/2017  Decreased Interest 0 0 0 0 0  Down, Depressed, Hopeless 0 0 0 0 0  PHQ - 2 Score 0 0 0 0 0  Altered sleeping 0 2 0 1 2  Tired, decreased energy 0 1 0 0 1  Change in appetite 0 0 0 0 0  Feeling bad or failure about yourself  0 0 0 0 0  Trouble concentrating 0 0 0 0 0  Moving slowly or fidgety/restless 0 0 0 0 0  Suicidal thoughts 0 0 0 0 0  PHQ-9 Score 0 3 0 1 3  Difficult doing work/chores - Not difficult at all Not difficult at all Not difficult at all -      Impression and Recommendations:     1. Insomnia, unspecified type   2. Generalized OA- hands, back, neck, hips, knees   3. Irritable bowel syndrome with constipation   4. Gastroesophageal reflux disease, unspecified whether esophagitis present   5. NSAID long-term use   6. Environmental and seasonal allergies   7. Allergic conjunctivitis of both eyes      - Last seen for CPE on 05/12/2019.  Generalized OA - Managed on chronic Meloxicam.  - Per patient, stable at this time. - Continue management as established.  - Will continue to monitor.  NSAID Long-Term use - As patient uses Mobic daily, told patient to take Pepcid daily along with it to help protect her stomach from gastric damage related to Mobic. - counseling done - Will continue to monitor.   GERD - Per patient, symptoms well-controlled at this time, stable. - Denies concerns.  Patient is not currently using medication to control symptoms. - Continue management as established - through prudent diet, timing of meals etc  - Will continue to monitor.   Insomnia - Managed on 1.5 tablets of trazodone and 10 mg melatonin nightly. - Stable at this time on current management. - Continue treatment plan as established.  See med list.  -  Encouraged patient to continue to engage in prudent sleep habits.  - Will continue to monitor.   Environmental & Seasonal Allergies - Allergic Conjunctivitis of Both Eyes - Stable at this time on current management. - Continue treatment plan as established.  - Patanol prescribed for additional assistance with symptoms.  See med list. - Advised patient that she may use Patanol OTC if desired.  - Will continue to monitor.   IBS with Constipation - Per patient, stable on Miralax. - Patient discontinued Linzess and discontinued Colace on her own as linzess too strong and has to be near bathroom and colace little help. - Continue management as established.  See med list.  - Will continue to monitor.   - Additionally, when appropriate, discussion had with patient regarding our treatment plan, and their biases/concerns about that plan were used in my medical decision making today.    - The patient agreed with the plan and demonstrated an understanding of the instructions.   No barriers to understanding were identified.     - The patient was advised to call back or seek an in-person evaluation if the symptoms worsen or if the condition fails to improve as anticipated.    Return in about 6 months (around 05/16/2020) for f/up 6 months, CPE next OV.    Meds ordered this encounter  Medications  . meloxicam (MOBIC) 15 MG tablet    Sig: Take 1 tablet (15 mg total) by mouth daily.    Dispense:  90 tablet    Refill:  1  . traZODone (DESYREL) 100 MG tablet    Sig: TAKE ONE AND ONE-HALF TABLETS AT BEDTIME AS NEEDED FOR SLEEP    Dispense:  135 tablet    Refill:  1  . polyethylene glycol powder (GLYCOLAX/MIRALAX) 17 GM/SCOOP powder    Sig: Take 17 g by mouth 2 (two) times daily as needed.    Dispense:  3350 g    Refill:  1  . famotidine (PEPCID) 20 MG tablet    Sig: Take 1 tablet (20 mg total) by mouth at bedtime.    Dispense:  90 tablet    Refill:  1  . olopatadine (PATANOL) 0.1 %  ophthalmic solution    Sig: Place 1 drop into both eyes 2 (two) times daily.    Dispense:  5 mL    Refill:  5     Medications Discontinued During This Encounter  Medication Reason  . LINZESS 72 MCG capsule   . ranitidine (ZANTAC) 75 MG tablet   . docusate sodium (COLACE) 100 MG capsule   . polyethylene glycol powder (GLYCOLAX/MIRALAX) 17 GM/SCOOP powder Reorder  . traZODone (DESYREL) 100 MG tablet Reorder  . meloxicam (MOBIC) 15 MG tablet Reorder      Time spent on visit including pre-visit chart review and post-visit  care was 13 minutes.    Note:  This note was prepared with assistance of Dragon voice recognition software. Occasional wrong-word or sound-a-like substitutions may have occurred due to the inherent limitations of voice recognition software.  The [place] was signed into law in 2016 which includes the topic of electronic health records.  This provides immediate access to information in MyChart.  This includes consultation notes, operative notes, office notes, lab results and pathology reports.  If you have any questions about what you read please let us know at your next visit or call us at the office.  We are right here with you.  This document serves as a record of services personally performed by Mellody Dance, DO. It was created on her behalf by Toni Amend, a trained medical scribe. The creation of this record is based on the scribe's personal observations and the provider's statements to them.   The above documentation from Toni Amend, medical scribe, has been reviewed by Marjory Sneddon, D.O. __________________________________________________________________________________     Patient Care Team    Relationship Specialty Notifications Start End  Mellody Dance, DO PCP - General Family Medicine  04/03/16   Avon Gully, NP Nurse Practitioner Obstetrics and Gynecology  04/03/16   Gavin Pound, MD Consulting Physician  Rheumatology  04/20/17   Juanita Craver, MD Consulting Physician Gastroenterology  04/20/17      -Vitals obtained; medications/ allergies reconciled;  personal medical, social, Sx etc.histories were updated by CMA, reviewed by me and are reflected in chart   Patient Active Problem List   Diagnosis Date Noted  . Gastroesophageal reflux disease 11/15/2019  . Allergic conjunctivitis of both eyes 11/15/2019  . Irritable bowel syndrome with constipation 05/12/2019  . High risk medication use 11/08/2017  . NSAID long-term use 11/08/2017  . Vitamin D deficiency 10/29/2017  . Glucose intolerance (impaired glucose tolerance) 10/29/2017  . Perimenopausal- last mense 2009 05/19/2016  . Osteoarthritis of carpometacarpal [facility]) joint of both thumbs L>er R 05/19/2016  . Arthritis- b/l knees 05/19/2016  . Environmental and seasonal allergies 04/05/2016  . Generalized OA- hands, back, neck, hips, knees 04/05/2016  . Overweight (BMI 25.0-29.9) 04/05/2016  . Idiopathic scoliosis 04/03/2016  . Family history of colon cancer- father age late 28's 04/03/2016  . Chronic back pain 05/03/2015  . Insomnia 12/10/2005  . Difficulty hearing 11/12/2005     Current Meds  Medication Sig  . loratadine (CLARITIN) 10 MG tablet Take 10 mg by mouth daily.  . Melatonin 10 MG TABS Take 1 tablet by mouth as needed. For sleep and get the sustained release tablets to help you stay asleep  . meloxicam (MOBIC) 15 MG tablet Take 1 tablet (15 mg total) by mouth daily.  . MULTIPLE VITAMIN PO Take 1 tablet by mouth daily.  . polyethylene glycol powder (GLYCOLAX/MIRALAX) 17 GM/SCOOP powder Take 17 g by mouth 2 (two) times daily as needed.  . traZODone (DESYREL) 100 MG tablet TAKE ONE AND ONE-HALF TABLETS AT BEDTIME AS NEEDED FOR SLEEP  . [DISCONTINUED] meloxicam (MOBIC) 15 MG tablet Take 1 tablet (15 mg total) by mouth daily. **PATIENT NEEDS APT FOR FURTHER REFILLS**  . [DISCONTINUED] polyethylene glycol powder  (GLYCOLAX/MIRALAX) 17 GM/SCOOP powder Take 17 g by mouth 2 (two) times daily as needed.  . [DISCONTINUED] traZODone (DESYREL) 100 MG tablet TAKE ONE AND ONE-HALF TABLETS AT BEDTIME AS NEEDED FOR SLEEP     Allergies:  Allergies  Allergen Reactions  . Aspirin Swelling  . Penicillins Swelling  .  Sulfa Antibiotics Hives     ROS:  See above HPI for pertinent positives and negatives   Objective:   Blood pressure 119/72, pulse 84, height 5' 8.5" (1.74 m), weight 177 lb (80.3 kg).  (if some vitals are omitted, this means that patient was UNABLE to obtain them even though they were asked to get them prior to OV today.  They were asked to call us at their earliest convenience with these once obtained. ) General: A & O * 3; sounds in no acute distress; in usual state of health.  Skin: Pt confirms warm and dry extremities and pink fingertips HEENT: Pt confirms lips non-cyanotic Chest: Patient confirms normal chest excursion and movement Respiratory: speaking in full sentences, no conversational dyspnea; patient confirms no use of accessory muscles Psych: insight appears good, mood- appears full

## 2020-01-25 ENCOUNTER — Ambulatory Visit: Payer: Self-pay

## 2020-04-20 ENCOUNTER — Other Ambulatory Visit: Payer: Self-pay | Admitting: Family Medicine

## 2020-04-20 DIAGNOSIS — G4700 Insomnia, unspecified: Secondary | ICD-10-CM

## 2020-04-25 ENCOUNTER — Other Ambulatory Visit: Payer: Self-pay | Admitting: Family Medicine

## 2020-05-01 ENCOUNTER — Other Ambulatory Visit: Payer: Self-pay | Admitting: Physician Assistant

## 2020-05-01 DIAGNOSIS — Z1231 Encounter for screening mammogram for malignant neoplasm of breast: Secondary | ICD-10-CM

## 2020-05-07 ENCOUNTER — Telehealth: Payer: Self-pay | Admitting: Physician Assistant

## 2020-05-07 DIAGNOSIS — G4700 Insomnia, unspecified: Secondary | ICD-10-CM

## 2020-05-07 MED ORDER — MELOXICAM 15 MG PO TABS: 15.0000 mg | ORAL_TABLET | Freq: Every day | ORAL | 0 refills | Status: DC

## 2020-05-07 MED ORDER — TRAZODONE HCL 100 MG PO TABS: 150.0000 mg | ORAL_TABLET | Freq: Every evening | ORAL | 0 refills | Status: DC | PRN

## 2020-05-07 NOTE — Addendum Note (Signed)
Addended by: Sylvester Harder on: 05/07/2020 01:42 PM   Modules accepted: Orders

## 2020-05-07 NOTE — Telephone Encounter (Signed)
Patient is requesting a refill of her meloxicam and trazodone, if approved please send to [facility].

## 2020-05-07 NOTE — Telephone Encounter (Signed)
60 day supply of meds sent to pharmacy. Patient needs apt for further refills.   Please contact patient to schedule OV. AS,CMA

## 2020-05-31 ENCOUNTER — Ambulatory Visit
Admission: RE | Admit: 2020-05-31 | Discharge: 2020-05-31 | Disposition: A | Discharge status: Home / Self Care | Payer: BC Managed Care – PPO | Source: Ambulatory Visit | Attending: Physician Assistant | Admitting: Physician Assistant

## 2020-05-31 ENCOUNTER — Other Ambulatory Visit: Payer: Self-pay

## 2020-05-31 DIAGNOSIS — Z1231 Encounter for screening mammogram for malignant neoplasm of breast: Secondary | ICD-10-CM

## 2020-06-19 ENCOUNTER — Telehealth: Payer: Self-pay | Admitting: Physician Assistant

## 2020-06-19 ENCOUNTER — Other Ambulatory Visit: Payer: Self-pay | Admitting: Physician Assistant

## 2020-06-19 DIAGNOSIS — G4700 Insomnia, unspecified: Secondary | ICD-10-CM

## 2020-06-19 NOTE — Telephone Encounter (Signed)
Please call patient to schedule OV apt for med refills. AS, CMA °

## 2020-06-25 ENCOUNTER — Other Ambulatory Visit: Payer: Self-pay | Admitting: Physician Assistant

## 2020-08-09 ENCOUNTER — Ambulatory Visit (INDEPENDENT_AMBULATORY_CARE_PROVIDER_SITE_OTHER): Payer: BC Managed Care – PPO | Admitting: Physician Assistant

## 2020-08-09 ENCOUNTER — Other Ambulatory Visit: Payer: Self-pay

## 2020-08-09 ENCOUNTER — Encounter: Payer: Self-pay | Admitting: Physician Assistant

## 2020-08-09 ENCOUNTER — Ambulatory Visit: Payer: BC Managed Care – PPO | Admitting: Physician Assistant

## 2020-08-09 VITALS — BP 126/78 | HR 67 | Ht 68.5 in | Wt 189.5 lb

## 2020-08-09 DIAGNOSIS — M159 Polyosteoarthritis, unspecified: Secondary | ICD-10-CM

## 2020-08-09 DIAGNOSIS — G4700 Insomnia, unspecified: Secondary | ICD-10-CM | POA: Diagnosis not present

## 2020-08-09 DIAGNOSIS — Z791 Long term (current) use of non-steroidal anti-inflammatories (NSAID): Secondary | ICD-10-CM

## 2020-08-09 MED ORDER — MELOXICAM 15 MG PO TABS
ORAL_TABLET | ORAL | 1 refills | Status: DC
Start: 2020-08-09 — End: 2021-02-14

## 2020-08-09 MED ORDER — TRAZODONE HCL 100 MG PO TABS: ORAL_TABLET | ORAL | 1 refills | Status: DC

## 2020-08-09 NOTE — Progress Notes (Signed)
Established Patient Office Visit  Subjective:  Patient ID: Robyn Roberts, female    DOB: 03-19-1956  Age: 65 y.o. MRN: [id number]  CC:  Chief Complaint  Patient presents with  . Insomnia  . Depression    HPI Robyn Roberts presents for follow up on insomnia. Reports is doing well and has no acute concerns. Continues to take Trazodone 150 mg to help with sleep. States does not take melatonin every time with Trazodone, only if needed.  Osteoarthritis: Patient takes Meloxicam almost daily for pain. States forgets to take Pepcid daily but will try to do better. Denies abdominal pain, N/V, hemoptysis, unintentional weight loss, or melena. Meloxicam keeps arthritic pain stable.  Past Medical History:  Diagnosis Date  . Arthritis   . Chronic back pain   . Scoliosis     Past Surgical History:  Procedure Laterality Date  . CESAREAN SECTION  1991  . FOOT SURGERY Bilateral 1985   bunionectomy    Family History  Problem Relation Age of Onset  . Heart disease Mother   . Colon polyps Mother   . Cancer Mother        thyroid  . Asthma Mother   . Cancer Father        colon  . Diabetes Father   . Depression Father   . CVA Father   . Breast cancer Paternal Aunt     Social History   Socioeconomic History  . Marital status: Married    Spouse name: Not on file  . Number of children: Not on file  . Years of education: Not on file  . Highest education level: Not on file  Occupational History  . Not on file  Tobacco Use  . Smoking status: Never Smoker  . Smokeless tobacco: Never Used  Vaping Use  . Vaping Use: Never used  Substance and Sexual Activity  . Alcohol use: Yes    Comment: rare  . Drug use: No  . Sexual activity: Yes  Other Topics Concern  . Not on file  Social History Narrative  . Not on file   Social Determinants of Health   Financial Resource Strain: Not on file  Food Insecurity: Not on file  Transportation Needs: Not on file  Physical Activity: Not  on file  Stress: Not on file  Social Connections: Not on file  Intimate Partner Violence: Not on file    Outpatient Medications Prior to Visit  Medication Sig Dispense Refill  . famotidine (PEPCID) 20 MG tablet Take 1 tablet (20 mg total) by mouth at bedtime. 90 tablet 1  . loratadine (CLARITIN) 10 MG tablet Take 10 mg by mouth daily.    . Melatonin 10 MG TABS Take 1 tablet by mouth as needed. For sleep and get the sustained release tablets to help you stay asleep    . MULTIPLE VITAMIN PO Take 1 tablet by mouth daily.    Marland Kitchen olopatadine (PATANOL) 0.1 % ophthalmic solution Place 1 drop into both eyes 2 (two) times daily. 5 mL 5  . polyethylene glycol powder (GLYCOLAX/MIRALAX) 17 GM/SCOOP powder Take 17 g by mouth 2 (two) times daily as needed. 3350 g 1  . meloxicam (MOBIC) 15 MG tablet TAKE 1 TABLET DAILY (NEED APPOINTMENT FOR FURTHER REFILLS) 60 tablet 0  . traZODone (DESYREL) 100 MG tablet TAKE ONE AND ONE-HALF TABLETS (150 MG) AT BEDTIME AS NEEDED FOR SLEEP. (NEED APPOINTMENT FOR REFILLS) (Patient taking differently: 150 mg.) 45 tablet 0   No facility-administered  medications prior to visit.    Allergies  Allergen Reactions  . Aspirin Swelling  . Penicillins Swelling  . Sulfa Antibiotics Hives    ROS Review of Systems A fourteen system review of systems was performed and found to be positive as per HPI.   Objective:    Physical Exam General:  Well Developed, well nourished, appropriate for stated age.  Neuro:  Alert and oriented,  extra-ocular muscles intact  HEENT:  Normocephalic, atraumatic, neck supple Skin:  no gross rash, warm, pink. Cardiac:  RRR, S1 S2 Respiratory:  ECTA B/L w/o wheezing, Not using accessory muscles, speaking in full sentences- unlabored. Vascular:  Ext warm, no cyanosis apprec.; cap RF less 2 sec. Psych:  No HI/SI, judgement and insight good, Euthymic mood. Full Affect.   BP 126/78   Pulse 67   Ht 5' 8.5" (1.74 m)   Wt 189 lb 8 oz (86 kg)    SpO2 98%   BMI 28.39 kg/m  Wt Readings from Last 3 Encounters:  08/09/20 189 lb 8 oz (86 kg)  11/15/19 177 lb (80.3 kg)  05/12/19 186 lb 3.2 oz (84.5 kg)     Health Maintenance Due  Topic Date Due  . Hepatitis C Screening  Never done  . COVID-19 Vaccine (1) Never done  . HIV Screening  Never done  . INFLUENZA VACCINE  02/19/2020    There are no preventive care reminders to display for this patient.  Lab Results  Component Value Date   TSH 2.790 05/12/2019   Lab Results  Component Value Date   WBC 8.9 05/12/2019   HGB 14.5 05/12/2019   HCT 43.7 05/12/2019   MCV 91 05/12/2019   PLT 260 05/12/2019   Lab Results  Component Value Date   NA 142 08/09/2020   K 4.3 08/09/2020   CO2 24 08/09/2020   GLUCOSE 94 08/09/2020   BUN 21 08/09/2020   CREATININE 0.92 08/09/2020   BILITOT 0.4 08/09/2020   ALKPHOS 92 08/09/2020   AST 21 08/09/2020   ALT 30 08/09/2020   PROT 6.5 08/09/2020   ALBUMIN 4.4 08/09/2020   CALCIUM 9.5 08/09/2020   ANIONGAP 2 (L) 12/05/2013   Lab Results  Component Value Date   CHOL 136 05/12/2019   Lab Results  Component Value Date   HDL 54 05/12/2019   Lab Results  Component Value Date   LDLCALC 55 05/12/2019   Lab Results  Component Value Date   TRIG 165 (H) 05/12/2019   Lab Results  Component Value Date   CHOLHDL 2.5 05/12/2019   Lab Results  Component Value Date   HGBA1C 6.0 (H) 05/12/2019      Assessment & Plan:   Problem List Items Addressed This Visit      Musculoskeletal and Integument   Generalized OA- hands, back, neck, hips, knees (Chronic)   Relevant Medications   meloxicam (MOBIC) 15 MG tablet     Other   Insomnia - Primary (Chronic)   Relevant Medications   traZODone (DESYREL) 100 MG tablet   NSAID long-term use   Relevant Orders   Comp Met (CMET) (Completed)     Insomnia: -Stable. -Continue current medication regimen. -Recommend to establish a good sleep hygiene and reduce/limit caffeine use. -Will  continue to monitor.  Generalized OA, NSAID long-term use: -Stable. -Will collect CMP to monitor renal function. -Recommend to improve medication adherence with famotidine to reduce risk of GI side effects with NSAIDs. -Advise to try topical antiinflammatory such as Voltaren (diclofenac)  gel for pain relief and reduce use of Meloxicam. -Will continue to monitor.   Meds ordered this encounter  Medications  . meloxicam (MOBIC) 15 MG tablet    Sig: TAKE 1 TABLET DAILY    Dispense:  90 tablet    Refill:  1    Order Specific Question:   Supervising Provider    Answer:   Beatrice Lecher D [[id number]]  . traZODone (DESYREL) 100 MG tablet    Sig: TAKE ONE AND ONE-HALF TABLETS (150 MG) AT BEDTIME AS NEEDED FOR SLEEP. (NEED APPOINTMENT FOR REFILLS)    Dispense:  135 tablet    Refill:  1    Order Specific Question:   Supervising Provider    Answer:   Beatrice Lecher D [[id number]]    Follow-up: Return in about 4 months (around 12/07/2020) for CPE and FBW 1 wk prior.    Lorrene Reid, PA-C

## 2020-08-09 NOTE — Patient Instructions (Signed)
Osteoarthritis  Osteoarthritis is a type of arthritis. It refers to joint pain or joint disease. Osteoarthritis affects tissue that covers the ends of bones in joints (cartilage). Cartilage acts as a cushion between the bones and helps them move smoothly. Osteoarthritis occurs when cartilage in the joints gets worn down. Osteoarthritis is sometimes called "wear and tear" arthritis. Osteoarthritis is the most common form of arthritis. It often occurs in older people. It is a condition that gets worse over time. The joints most often affected by this condition are in the fingers, toes, hips, knees, and spine, including the neck and lower back. What are the causes? This condition is caused by the wearing down of cartilage that covers the ends of bones. What increases the risk? The following factors may make you more likely to develop this condition:  Being age 50 or older.  Obesity.  Overuse of joints.  Past injury of a joint.  Past surgery on a joint.  Family history of osteoarthritis. What are the signs or symptoms? The main symptoms of this condition are pain, swelling, and stiffness in the joint. Other symptoms may include:  An enlarged joint.  More pain and further damage caused by small pieces of bone or cartilage that break off and float inside of the joint.  Small deposits of bone (osteophytes) that grow on the edges of the joint.  A grating or scraping feeling inside the joint when you move it.  Popping or creaking sounds when you move.  Difficulty walking or exercising.  An inability to grip items, twist your hand(s), or control the movements of your hands and fingers. How is this diagnosed? This condition may be diagnosed based on:  Your medical history.  A physical exam.  Your symptoms.  X-rays of the affected joint(s).  Blood tests to rule out other types of arthritis. How is this treated? There is no cure for this condition, but treatment can help control  pain and improve joint function. Treatment may include a combination of therapies, such as:  Pain relief techniques, such as: ? Applying heat and cold to the joint. ? Massage. ? A form of talk therapy called cognitive behavioral therapy (CBT). This therapy helps you set goals and follow up on the changes that you make.  Medicines for pain and inflammation. The medicines can be taken by mouth or applied to the skin. They include: ? NSAIDs, such as ibuprofen. ? Prescription medicines. ? Strong anti-inflammatory medicines (corticosteroids). ? Certain nutritional supplements.  A prescribed exercise program. You may work with a physical therapist.  Assistive devices, such as a brace, wrap, splint, specialized glove, or cane.  A weight control plan.  Surgery, such as: ? An osteotomy. This is done to reposition the bones and relieve pain or to remove loose pieces of bone and cartilage. ? Joint replacement surgery. You may need this surgery if you have advanced osteoarthritis. Follow these instructions at home: Activity  Rest your affected joints as told by your health care provider.  Exercise as told by your health care provider. He or she may recommend specific types of exercise, such as: ? Strengthening exercises. These are done to strengthen the muscles that support joints affected by arthritis. ? Aerobic activities. These are exercises, such as brisk walking or water aerobics, that increase your heart rate. ? Range-of-motion activities. These help your joints move more easily. ? Balance and agility exercises. Managing pain, stiffness, and swelling  If directed, apply heat to the affected area as often   as told by your health care provider. Use the heat source that your health care provider recommends, such as a moist heat pack or a heating pad. ? If you have a removable assistive device, remove it as told by your health care provider. ? Place a towel between your skin and the heat  source. If your health care provider tells you to keep the assistive device on while you apply heat, place a towel between the assistive device and the heat source. ? Leave the heat on for 20-30 minutes. ? Remove the heat if your skin turns bright red. This is especially important if you are unable to feel pain, heat, or cold. You may have a greater risk of getting burned.  If directed, put ice on the affected area. To do this: ? If you have a removable assistive device, remove it as told by your health care provider. ? Put ice in a plastic bag. ? Place a towel between your skin and the bag. If your health care provider tells you to keep the assistive device on during icing, place a towel between the assistive device and the bag. ? Leave the ice on for 20 minutes, 2-3 times a day. ? Move your fingers or toes often to reduce stiffness and swelling. ? Raise (elevate) the injured area above the level of your heart while you are sitting or lying down.      General instructions  Take over-the-counter and prescription medicines only as told by your health care provider.  Maintain a healthy weight. Follow instructions from your health care provider for weight control.  Do not use any products that contain nicotine or tobacco, such as cigarettes, e-cigarettes, and chewing tobacco. If you need help quitting, ask your health care provider.  Use assistive devices as told by your health care provider.  Keep all follow-up visits as told by your health care provider. This is important. Where to find more information  National Institute of Arthritis and Musculoskeletal and Skin Diseases: www.niams.nih.gov  National Institute on Aging: www.nia.nih.gov  American College of Rheumatology: www.rheumatology.org Contact a health care provider if:  You have redness, swelling, or a feeling of warmth in a joint that gets worse.  You have a fever along with joint or muscle aches.  You develop a  rash.  You have trouble doing your normal activities. Get help right away if:  You have pain that gets worse and is not relieved by pain medicine. Summary  Osteoarthritis is a type of arthritis that affects tissue covering the ends of bones in joints (cartilage).  This condition is caused by the wearing down of cartilage that covers the ends of bones.  The main symptom of this condition is pain, swelling, and stiffness in the joint.  There is no cure for this condition, but treatment can help control pain and improve joint function. This information is not intended to replace advice given to you by your health care provider. Make sure you discuss any questions you have with your health care provider. Document Revised: 07/04/2019 Document Reviewed: 07/04/2019 Elsevier Patient Education  2021 Elsevier Inc.  

## 2020-08-10 LAB — COMPREHENSIVE METABOLIC PANEL
ALT: 30 IU/L (ref 0–32)
AST: 21 IU/L (ref 0–40)
Albumin/Globulin Ratio: 2.1 (ref 1.2–2.2)
Albumin: 4.4 g/dL (ref 3.8–4.8)
Alkaline Phosphatase: 92 IU/L (ref 44–121)
BUN/Creatinine Ratio: 23 (ref 12–28)
BUN: 21 mg/dL (ref 8–27)
Bilirubin Total: 0.4 mg/dL (ref 0.0–1.2)
CO2: 24 mmol/L (ref 20–29)
Calcium: 9.5 mg/dL (ref 8.7–10.3)
Chloride: 105 mmol/L (ref 96–106)
Creatinine, Ser: 0.92 mg/dL (ref 0.57–1.00)
GFR calc Af Amer: 77 mL/min/{1.73_m2} (ref >59)
GFR calc non Af Amer: 66 mL/min/{1.73_m2} (ref >59)
Globulin, Total: 2.1 g/dL (ref 1.5–4.5)
Glucose: 94 mg/dL (ref 65–99)
Potassium: 4.3 mmol/L (ref 3.5–5.2)
Sodium: 142 mmol/L (ref 134–144)
Total Protein: 6.5 g/dL (ref 6.0–8.5)

## 2020-11-22 ENCOUNTER — Other Ambulatory Visit: Payer: Self-pay

## 2020-11-22 DIAGNOSIS — Z0000 Encounter for general adult medical examination without abnormal findings: Secondary | ICD-10-CM

## 2020-11-29 ENCOUNTER — Other Ambulatory Visit: Payer: Self-pay

## 2020-11-29 ENCOUNTER — Other Ambulatory Visit: Payer: BC Managed Care – PPO

## 2020-11-29 DIAGNOSIS — Z0000 Encounter for general adult medical examination without abnormal findings: Secondary | ICD-10-CM | POA: Diagnosis not present

## 2020-11-30 LAB — LIPID PANEL
Chol/HDL Ratio: 2.4 ratio (ref 0.0–4.4)
Cholesterol, Total: 125 mg/dL (ref 100–199)
HDL: 52 mg/dL (ref >39)
LDL Chol Calc (NIH): 55 mg/dL (ref 0–99)
Triglycerides: 95 mg/dL (ref 0–149)
VLDL Cholesterol Cal: 18 mg/dL (ref 5–40)

## 2020-11-30 LAB — CBC
Hematocrit: 41.4 % (ref 34.0–46.6)
Hemoglobin: 14.1 g/dL (ref 11.1–15.9)
MCH: 30.3 pg (ref 26.6–33.0)
MCHC: 34.1 g/dL (ref 31.5–35.7)
MCV: 89 fL (ref 79–97)
Platelets: 248 10*3/uL (ref 150–450)
RBC: 4.66 x10E6/uL (ref 3.77–5.28)
RDW: 11.9 % (ref 11.7–15.4)
WBC: 10.4 10*3/uL (ref 3.4–10.8)

## 2020-11-30 LAB — COMPREHENSIVE METABOLIC PANEL
ALT: 29 IU/L (ref 0–32)
AST: 21 IU/L (ref 0–40)
Albumin/Globulin Ratio: 2.9 — ABNORMAL HIGH (ref 1.2–2.2)
Albumin: 4.6 g/dL (ref 3.8–4.8)
Alkaline Phosphatase: 91 IU/L (ref 44–121)
BUN/Creatinine Ratio: 26 (ref 12–28)
BUN: 20 mg/dL (ref 8–27)
Bilirubin Total: 0.5 mg/dL (ref 0.0–1.2)
CO2: 22 mmol/L (ref 20–29)
Calcium: 9.3 mg/dL (ref 8.7–10.3)
Chloride: 104 mmol/L (ref 96–106)
Creatinine, Ser: 0.77 mg/dL (ref 0.57–1.00)
Globulin, Total: 1.6 g/dL (ref 1.5–4.5)
Glucose: 97 mg/dL (ref 65–99)
Potassium: 4.4 mmol/L (ref 3.5–5.2)
Sodium: 141 mmol/L (ref 134–144)
Total Protein: 6.2 g/dL (ref 6.0–8.5)
eGFR: 87 mL/min/{1.73_m2} (ref >59)

## 2020-11-30 LAB — HEMOGLOBIN A1C
Est. average glucose Bld gHb Est-mCnc: 126 mg/dL
Hgb A1c MFr Bld: 6 % — ABNORMAL HIGH (ref 4.8–5.6)

## 2020-11-30 LAB — TSH: TSH: 2.72 u[IU]/mL (ref 0.450–4.500)

## 2020-12-06 ENCOUNTER — Other Ambulatory Visit: Payer: Self-pay

## 2020-12-06 ENCOUNTER — Ambulatory Visit (INDEPENDENT_AMBULATORY_CARE_PROVIDER_SITE_OTHER): Payer: BC Managed Care – PPO | Admitting: Physician Assistant

## 2020-12-06 ENCOUNTER — Encounter: Payer: Self-pay | Admitting: Physician Assistant

## 2020-12-06 VITALS — BP 112/71 | HR 68 | Temp 98.0°F | Ht 68.0 in | Wt 189.2 lb

## 2020-12-06 DIAGNOSIS — Z791 Long term (current) use of non-steroidal anti-inflammatories (NSAID): Secondary | ICD-10-CM | POA: Diagnosis not present

## 2020-12-06 DIAGNOSIS — Z0000 Encounter for general adult medical examination without abnormal findings: Secondary | ICD-10-CM

## 2020-12-06 DIAGNOSIS — K219 Gastro-esophageal reflux disease without esophagitis: Secondary | ICD-10-CM

## 2020-12-06 DIAGNOSIS — H1013 Acute atopic conjunctivitis, bilateral: Secondary | ICD-10-CM | POA: Diagnosis not present

## 2020-12-06 DIAGNOSIS — L821 Other seborrheic keratosis: Secondary | ICD-10-CM | POA: Insufficient documentation

## 2020-12-06 DIAGNOSIS — Z23 Encounter for immunization: Secondary | ICD-10-CM | POA: Diagnosis not present

## 2020-12-06 MED ORDER — OLOPATADINE HCL 0.1 % OP SOLN: 1.0000 [drp] | Freq: Two times a day (BID) | OPHTHALMIC | 2 refills | Status: DC

## 2020-12-06 MED ORDER — FAMOTIDINE 20 MG PO TABS: 20.0000 mg | ORAL_TABLET | Freq: Every day | ORAL | 1 refills | Status: DC

## 2020-12-06 NOTE — Patient Instructions (Signed)
Preventive Care 84-64 Years Old, Female Preventive care refers to lifestyle choices and visits with your health care provider that can promote health and wellness. This includes:  A yearly physical exam. This is also called an annual wellness visit.  Regular dental and eye exams.  Immunizations.  Screening for certain conditions.  Healthy lifestyle choices, such as: ? Eating a healthy diet. ? Getting regular exercise. ? Not using drugs or products that contain nicotine and tobacco. ? Limiting alcohol use. What can I expect for my preventive care visit? Physical exam Your health care provider will check your:  Height and weight. These may be used to calculate your BMI (body mass index). BMI is a measurement that tells if you are at a healthy weight.  Heart rate and blood pressure.  Body temperature.  Skin for abnormal spots. Counseling Your health care provider may ask you questions about your:  Past medical problems.  Family's medical history.  Alcohol, tobacco, and drug use.  Emotional well-being.  Home life and relationship well-being.  Sexual activity.  Diet, exercise, and sleep habits.  Work and work Statistician.  Access to firearms.  Method of birth control.  Menstrual cycle.  Pregnancy history. What immunizations do I need? Vaccines are usually given at various ages, according to a schedule. Your health care provider will recommend vaccines for you based on your age, medical history, and lifestyle or other factors, such as travel or where you work.   What tests do I need? Blood tests  Lipid and cholesterol levels. These may be checked every 5 years, or more often if you are over 3 years old.  Hepatitis C test.  Hepatitis B test. Screening  Lung cancer screening. You may have this screening every year starting at age 73 if you have a 30-pack-year history of smoking and currently smoke or have quit within the past 15 years.  Colorectal cancer  screening. ? All adults should have this screening starting at age 52 and continuing until age 17. ? Your health care provider may recommend screening at age 49 if you are at increased risk. ? You will have tests every 1-10 years, depending on your results and the type of screening test.  Diabetes screening. ? This is done by checking your blood sugar (glucose) after you have not eaten for a while (fasting). ? You may have this done every 1-3 years.  Mammogram. ? This may be done every 1-2 years. ? Talk with your health care provider about when you should start having regular mammograms. This may depend on whether you have a family history of breast cancer.  BRCA-related cancer screening. This may be done if you have a family history of breast, ovarian, tubal, or peritoneal cancers.  Pelvic exam and Pap test. ? This may be done every 3 years starting at age 10. ? Starting at age 11, this may be done every 5 years if you have a Pap test in combination with an HPV test. Other tests  STD (sexually transmitted disease) testing, if you are at risk.  Bone density scan. This is done to screen for osteoporosis. You may have this scan if you are at high risk for osteoporosis. Talk with your health care provider about your test results, treatment options, and if necessary, the need for more tests. Follow these instructions at home: Eating and drinking  Eat a diet that includes fresh fruits and vegetables, whole grains, lean protein, and low-fat dairy products.  Take vitamin and mineral supplements  as recommended by your health care provider.  Do not drink alcohol if: ? Your health care provider tells you not to drink. ? You are pregnant, may be pregnant, or are planning to become pregnant.  If you drink alcohol: ? Limit how much you have to 0-1 drink a day. ? Be aware of how much alcohol is in your drink. In the [place] one drink equals one 12 oz bottle of beer (355 mL), one 5 oz glass of  wine (148 mL), or one 1 oz glass of hard liquor (44 mL).   Lifestyle  Take daily care of your teeth and gums. Brush your teeth every morning and night with fluoride toothpaste. Floss one time each day.  Stay active. Exercise for at least 30 minutes 5 or more days each week.  Do not use any products that contain nicotine or tobacco, such as cigarettes, e-cigarettes, and chewing tobacco. If you need help quitting, ask your health care provider.  Do not use drugs.  If you are sexually active, practice safe sex. Use a condom or other form of protection to prevent STIs (sexually transmitted infections).  If you do not wish to become pregnant, use a form of birth control. If you plan to become pregnant, see your health care provider for a prepregnancy visit.  If told by your health care provider, take low-dose aspirin daily starting at age 50.  Find healthy ways to cope with stress, such as: ? Meditation, yoga, or listening to music. ? Journaling. ? Talking to a trusted person. ? Spending time with friends and family. Safety  Always wear your seat belt while driving or riding in a vehicle.  Do not drive: ? If you have been drinking alcohol. Do not ride with someone who has been drinking. ? When you are tired or distracted. ? While texting.  Wear a helmet and other protective equipment during sports activities.  If you have firearms in your house, make sure you follow all gun safety procedures. What's next?  Visit your health care provider once a year for an annual wellness visit.  Ask your health care provider how often you should have your eyes and teeth checked.  Stay up to date on all vaccines. This information is not intended to replace advice given to you by your health care provider. Make sure you discuss any questions you have with your health care provider. Document Revised: 04/10/2020 Document Reviewed: 03/18/2018 Elsevier Patient Education  2021 Elsevier Inc.  

## 2020-12-06 NOTE — Progress Notes (Signed)
Subjective:     Robyn Roberts is a 65 y.o. female and is here for a comprehensive physical exam. The patient reports no problems.  Social History   Socioeconomic History  . Marital status: Married    Spouse name: Not on file  . Number of children: Not on file  . Years of education: Not on file  . Highest education level: Not on file  Occupational History  . Not on file  Tobacco Use  . Smoking status: Never Smoker  . Smokeless tobacco: Never Used  Vaping Use  . Vaping Use: Never used  Substance and Sexual Activity  . Alcohol use: Yes    Comment: rare  . Drug use: No  . Sexual activity: Yes  Other Topics Concern  . Not on file  Social History Narrative  . Not on file   Social Determinants of Health   Financial Resource Strain: Not on file  Food Insecurity: Not on file  Transportation Needs: Not on file  Physical Activity: Not on file  Stress: Not on file  Social Connections: Not on file  Intimate Partner Violence: Not on file   Health Maintenance  Topic Date Due  . COVID-19 Vaccine (3 - Booster for Moderna series) 01/17/2020  . HIV Screening  12/05/2021 (Originally 06/11/1971)  . Hepatitis C Screening  12/06/2021 (Originally 06/12/1974)  . INFLUENZA VACCINE  02/18/2021  . PAP SMEAR-Modifier  06/14/2021  . MAMMOGRAM  05/31/2022  . COLONOSCOPY (Pts 45-49yrs Insurance coverage will need to be confirmed)  09/23/2026  . TETANUS/TDAP  05/11/2029  . HPV VACCINES  Aged Out    The following portions of the patient's history were reviewed and updated as appropriate: allergies, current medications, past family history, past medical history, past social history, past surgical history and problem list.  Review of Systems Pertinent items noted in HPI and remainder of comprehensive ROS otherwise negative.   Objective:    BP 112/71   Pulse 68   Temp 98 F (36.7 C)   Ht 5\' 8"  (1.727 m)   Wt 189 lb 3.2 oz (85.8 kg)   SpO2 98%   BMI 28.77 kg/m  General appearance:  alert, cooperative and no distress Head: Normocephalic, without obvious abnormality, atraumatic Eyes: conjunctivae/corneas clear. PERRL, EOM's intact. Fundi benign. Ears: abnormal TM right ear - serous middle ear fluid and abnormal TM left ear - serous middle ear fluid Nose: Nares normal. Septum midline. Mucosa normal. No drainage or sinus tenderness. Throat: lips, mucosa, and tongue normal; teeth and gums normal Neck: no adenopathy, no carotid bruit, no JVD, supple, symmetrical, trachea midline and thyroid not enlarged, symmetric, no tenderness/mass/nodules Back: symmetric, no curvature. ROM normal. No CVA tenderness. Lungs: clear to auscultation bilaterally Heart: regular rate and rhythm, S1, S2 normal, no murmur, click, rub or gallop Abdomen: soft, non-tender; bowel sounds normal; no masses,  no organomegaly Extremities: extremities normal, atraumatic, no cyanosis or edema Pulses: 2+ and symmetric Skin: Skin color, texture, turgor normal. No rashes or lesions Lymph nodes: Cervical adenopathy: normal and Supraclavicular adenopathy: normal Neurologic: Grossly normal    Assessment:    Healthy female exam.    Plan:  -Discussed recent lab results, most labs are essentially within normal limits or stable from prior.  Lipid panel has improved from prior. -Encouraged to continue weight loss efforts, heart healthy diet and stay as active as possible. -Stay well-hydrated. -Continue to follow-up with OB/GYN. -UTD on mammogram, Pap smear colonoscopy.  Declined hep C and HIV screenings. -Agreeable to second  dose of Shingrix. -Continue current medication regimen. Provided needed refills. -Follow-up in 6 months for insomnia, OA, repeat CMP   See After Visit Summary for Counseling Recommendations

## 2020-12-20 ENCOUNTER — Telehealth: Payer: Self-pay | Admitting: Physician Assistant

## 2020-12-20 NOTE — Telephone Encounter (Signed)
Patient scheduled for 12/25/20. AS, CMA

## 2020-12-20 NOTE — Telephone Encounter (Signed)
Patient has a lingering COVID cough. Please advise, thanks.

## 2020-12-20 NOTE — Telephone Encounter (Signed)
Patient tested positive on Monday 12/10/20 and is still coughing. Patient denies SOB, difficulty breathing.

## 2020-12-25 ENCOUNTER — Encounter: Payer: Self-pay | Admitting: Nurse Practitioner

## 2020-12-25 ENCOUNTER — Other Ambulatory Visit: Payer: Self-pay

## 2020-12-25 ENCOUNTER — Ambulatory Visit (INDEPENDENT_AMBULATORY_CARE_PROVIDER_SITE_OTHER): Payer: BC Managed Care – PPO | Admitting: Nurse Practitioner

## 2020-12-25 VITALS — BP 125/80 | HR 88 | Temp 96.9°F | Ht 69.0 in | Wt 186.4 lb

## 2020-12-25 DIAGNOSIS — R059 Cough, unspecified: Secondary | ICD-10-CM | POA: Diagnosis not present

## 2020-12-25 DIAGNOSIS — U071 COVID-19: Secondary | ICD-10-CM | POA: Diagnosis not present

## 2020-12-25 DIAGNOSIS — J069 Acute upper respiratory infection, unspecified: Secondary | ICD-10-CM

## 2020-12-25 MED ORDER — ALBUTEROL SULFATE HFA 108 (90 BASE) MCG/ACT IN AERS: 2.0000 | INHALATION_SPRAY | Freq: Four times a day (QID) | RESPIRATORY_TRACT | 1 refills | Status: DC | PRN

## 2020-12-25 MED ORDER — METHYLPREDNISOLONE 4 MG PO TBPK: ORAL_TABLET | ORAL | 0 refills | Status: DC

## 2020-12-25 NOTE — Patient Instructions (Signed)

## 2020-12-25 NOTE — Progress Notes (Signed)
Acute Office Visit  Subjective:    Patient ID: Robyn Roberts, female    DOB: 03-08-1956, 65 y.o.   MRN: [id number]  Chief Complaint  Patient presents with  . Cough    Since having covid 19 2 weeks ago     HPI Patient is in today for evaluation of cough. She states that she was diagnosed with COVID 19 about 2 weeks ago. States that she is gradually getting better but continues to have congested cough. She states that over the weekend her cough was productive. States she was bringing up brown tinged sputum. States that she coughed so much that it hurt in her rib cage. States taht she has been taking Mucinex D and Delsym to relieve the cough. These have helped. She states that cough has gradually improved. No longer productive. States that ribs are not hurting any longer. She denies fever, chills, body aches, or muscle pain. She denies nausea, vomiting, or changes in bowel or bladder habits.   Past Medical History:  Diagnosis Date  . Arthritis   . Chronic back pain   . Scoliosis     Past Surgical History:  Procedure Laterality Date  . CESAREAN SECTION  1991  . FOOT SURGERY Bilateral 1985   bunionectomy    Family History  Problem Relation Age of Onset  . Heart disease Mother   . Colon polyps Mother   . Cancer Mother        thyroid  . Asthma Mother   . Cancer Father        colon  . Diabetes Father   . Depression Father   . CVA Father   . Breast cancer Paternal Aunt     Social History   Socioeconomic History  . Marital status: Married    Spouse name: Not on file  . Number of children: Not on file  . Years of education: Not on file  . Highest education level: Not on file  Occupational History  . Not on file  Tobacco Use  . Smoking status: Never Smoker  . Smokeless tobacco: Never Used  Vaping Use  . Vaping Use: Never used  Substance and Sexual Activity  . Alcohol use: Yes    Comment: rare  . Drug use: No  . Sexual activity: Yes  Other Topics Concern  . Not  on file  Social History Narrative  . Not on file   Social Determinants of Health   Financial Resource Strain: Not on file  Food Insecurity: Not on file  Transportation Needs: Not on file  Physical Activity: Not on file  Stress: Not on file  Social Connections: Not on file  Intimate Partner Violence: Not on file    Outpatient Medications Prior to Visit  Medication Sig Dispense Refill  . famotidine (PEPCID) 20 MG tablet Take 1 tablet (20 mg total) by mouth at bedtime. 90 tablet 1  . loratadine (CLARITIN) 10 MG tablet Take 10 mg by mouth daily.    . Melatonin 10 MG TABS Take 1 tablet by mouth as needed. For sleep and get the sustained release tablets to help you stay asleep    . meloxicam (MOBIC) 15 MG tablet TAKE 1 TABLET DAILY 90 tablet 1  . MULTIPLE VITAMIN PO Take 1 tablet by mouth daily.    Marland Kitchen olopatadine (PATANOL) 0.1 % ophthalmic solution Place 1 drop into both eyes 2 (two) times daily. 5 mL 2  . polyethylene glycol powder (GLYCOLAX/MIRALAX) 17 GM/SCOOP powder Take 17 g  by mouth 2 (two) times daily as needed. 3350 g 1  . traZODone (DESYREL) 100 MG tablet TAKE ONE AND ONE-HALF TABLETS (150 MG) AT BEDTIME AS NEEDED FOR SLEEP. (NEED APPOINTMENT FOR REFILLS) 135 tablet 1   No facility-administered medications prior to visit.    Allergies  Allergen Reactions  . Aspirin Swelling  . Penicillins Swelling  . Sulfa Antibiotics Hives    Review of Systems  Constitutional: Positive for fatigue. Negative for activity change, appetite change, chills, diaphoresis and fever.  HENT: Positive for congestion, postnasal drip and rhinorrhea. Negative for sore throat.   Respiratory: Positive for cough. Negative for chest tightness, shortness of breath and wheezing.   Cardiovascular: Negative for chest pain and palpitations.  Gastrointestinal: Negative for diarrhea and vomiting.  Genitourinary: Negative.   Musculoskeletal: Negative.   Allergic/Immunologic: Positive for environmental allergies.   Neurological: Positive for headaches.  Hematological: Negative.   Psychiatric/Behavioral: Negative.        Objective:    Physical Exam Vitals and nursing note reviewed.  Constitutional:      Appearance: Normal appearance. She is well-developed. She is ill-appearing.  HENT:     Head: Normocephalic and atraumatic.     Right Ear: Ear canal and external ear normal.     Left Ear: Ear canal and external ear normal.     Nose: Congestion present.     Mouth/Throat:     Pharynx: Posterior oropharyngeal erythema present.  Eyes:     Extraocular Movements: Extraocular movements intact.     Conjunctiva/sclera: Conjunctivae normal.     Pupils: Pupils are equal, round, and reactive to light.  Cardiovascular:     Rate and Rhythm: Normal rate and regular rhythm.     Pulses: Normal pulses.     Heart sounds: Normal heart sounds.  Pulmonary:     Effort: Pulmonary effort is normal.     Breath sounds: Normal breath sounds. No wheezing or rhonchi.     Comments: Congested, non productive cough observed in the office today Abdominal:     Palpations: Abdomen is soft.  Musculoskeletal:        General: Normal range of motion.     Cervical back: Normal range of motion and neck supple.  Skin:    General: Skin is warm and dry.     Capillary Refill: Capillary refill takes less than 2 seconds.  Neurological:     General: No focal deficit present.     Mental Status: She is alert and oriented to person, place, and time.  Psychiatric:        Mood and Affect: Mood normal.        Behavior: Behavior normal.        Thought Content: Thought content normal.        Judgment: Judgment normal.    Today's Vitals   12/25/20 0915  BP: 125/80  Pulse: 88  Temp: (!) 96.9 F (36.1 C)  SpO2: 96%  Weight: 186 lb 6.4 oz (84.6 kg)  Height: _0  (1.753 m)   Body mass index is 27.53 kg/m.   Wt Readings from Last 3 Encounters:  12/25/20 186 lb 6.4 oz (84.6 kg)  12/06/20 189 lb 3.2 oz (85.8 kg)  08/09/20 189  lb 8 oz (86 kg)    Health Maintenance Due  Topic Date Due  . Pneumococcal Vaccine 45-69 Years old (1 of 4 - PCV13) Never done  . COVID-19 Vaccine (3 - Moderna risk 4-dose series) 09/16/2019    There are no  preventive care reminders to display for this patient.   Lab Results  Component Value Date   TSH 2.720 11/29/2020   Lab Results  Component Value Date   WBC 10.4 11/29/2020   HGB 14.1 11/29/2020   HCT 41.4 11/29/2020   MCV 89 11/29/2020   PLT 248 11/29/2020   Lab Results  Component Value Date   NA 141 11/29/2020   K 4.4 11/29/2020   CO2 22 11/29/2020   GLUCOSE 97 11/29/2020   BUN 20 11/29/2020   CREATININE 0.77 11/29/2020   BILITOT 0.5 11/29/2020   ALKPHOS 91 11/29/2020   AST 21 11/29/2020   ALT 29 11/29/2020   PROT 6.2 11/29/2020   ALBUMIN 4.6 11/29/2020   CALCIUM 9.3 11/29/2020   ANIONGAP 2 (L) 12/05/2013   EGFR 87 11/29/2020   Lab Results  Component Value Date   CHOL 125 11/29/2020   Lab Results  Component Value Date   HDL 52 11/29/2020   Lab Results  Component Value Date   LDLCALC 55 11/29/2020   Lab Results  Component Value Date   TRIG 95 11/29/2020   Lab Results  Component Value Date   CHOLHDL 2.4 11/29/2020   Lab Results  Component Value Date   HGBA1C 6.0 (H) 11/29/2020       Assessment & Plan:  1. Upper respiratory tract infection due to COVID-19 virus Patient did have COVID 19 diagnosis two weeks ago. She has returned to work and is overall improving. Cough and congestion persistent. Start medrol taper. Take as directed for 6 days. Continue to rest and increase fluids when possible. Also continue using OTC cough suppressants such as Mucinex D and Delsym as needed and as indicated  - methylPREDNISolone (MEDROL) 4 MG TBPK tablet; Take by mouth as directed for 6 days  Dispense: 21 tablet; Refill: 0  2. Cough Add albuterol rescue inhaler. Use two inhalations up to four times daily as needed for cough. Continue taking Mucinex D and Delsym  as needed and as indicated. Start medrol taper and take as directed.  - methylPREDNISolone (MEDROL) 4 MG TBPK tablet; Take by mouth as directed for 6 days  Dispense: 21 tablet; Refill: 0 - albuterol (VENTOLIN HFA) 108 (90 Base) MCG/ACT inhaler; Inhale 2 puffs into the lungs every 6 (six) hours as needed for wheezing or shortness of breath.  Dispense: 1 each; Refill: 1  Problem List Items Addressed This Visit      Respiratory   Upper respiratory tract infection due to COVID-19 virus - Primary   Relevant Medications   methylPREDNISolone (MEDROL) 4 MG TBPK tablet     Other   Cough   Relevant Medications   methylPREDNISolone (MEDROL) 4 MG TBPK tablet   albuterol (VENTOLIN HFA) 108 (90 Base) MCG/ACT inhaler       Meds ordered this encounter  Medications  . methylPREDNISolone (MEDROL) 4 MG TBPK tablet    Sig: Take by mouth as directed for 6 days    Dispense:  21 tablet    Refill:  0    Order Specific Question:   Supervising Provider    Answer:   Beatrice Lecher D [[id number]]  . albuterol (VENTOLIN HFA) 108 (90 Base) MCG/ACT inhaler    Sig: Inhale 2 puffs into the lungs every 6 (six) hours as needed for wheezing or shortness of breath.    Dispense:  1 each    Refill:  1    Order Specific Question:   Supervising Provider    Answer:  METHENEY, CATHERINE D [[id number]]     Ronnell Freshwater, NP

## 2021-01-18 ENCOUNTER — Other Ambulatory Visit: Payer: Self-pay | Admitting: Physician Assistant

## 2021-01-18 DIAGNOSIS — G4700 Insomnia, unspecified: Secondary | ICD-10-CM

## 2021-01-29 ENCOUNTER — Telehealth: Payer: Self-pay | Admitting: Physician Assistant

## 2021-01-29 NOTE — Telephone Encounter (Signed)
The TSH and alc labs were not covered by insurance. (Back in May) The insurance company needs a new preventative diagnostic code. Please advise, thanks.

## 2021-01-29 NOTE — Telephone Encounter (Signed)
They will need to contact the billing department for any issues with billing they are having.

## 2021-02-14 ENCOUNTER — Other Ambulatory Visit: Payer: Self-pay | Admitting: Physician Assistant

## 2021-02-14 DIAGNOSIS — M159 Polyosteoarthritis, unspecified: Secondary | ICD-10-CM

## 2021-04-18 ENCOUNTER — Other Ambulatory Visit: Payer: Self-pay | Admitting: Physician Assistant

## 2021-04-18 DIAGNOSIS — G4700 Insomnia, unspecified: Secondary | ICD-10-CM

## 2021-05-15 ENCOUNTER — Other Ambulatory Visit: Payer: Self-pay | Admitting: Physician Assistant

## 2021-05-15 DIAGNOSIS — M159 Polyosteoarthritis, unspecified: Secondary | ICD-10-CM

## 2021-06-06 ENCOUNTER — Ambulatory Visit: Payer: BC Managed Care – PPO | Admitting: Physician Assistant

## 2021-07-11 ENCOUNTER — Encounter: Payer: Self-pay | Admitting: Physician Assistant

## 2021-07-11 ENCOUNTER — Ambulatory Visit (INDEPENDENT_AMBULATORY_CARE_PROVIDER_SITE_OTHER): Payer: BC Managed Care – PPO | Admitting: Physician Assistant

## 2021-07-11 ENCOUNTER — Other Ambulatory Visit: Payer: Self-pay

## 2021-07-11 VITALS — BP 120/69 | HR 70 | Temp 97.6°F | Ht 69.0 in | Wt 188.0 lb

## 2021-07-11 DIAGNOSIS — Z791 Long term (current) use of non-steroidal anti-inflammatories (NSAID): Secondary | ICD-10-CM | POA: Diagnosis not present

## 2021-07-11 DIAGNOSIS — K219 Gastro-esophageal reflux disease without esophagitis: Secondary | ICD-10-CM

## 2021-07-11 DIAGNOSIS — G4700 Insomnia, unspecified: Secondary | ICD-10-CM

## 2021-07-11 DIAGNOSIS — M159 Polyosteoarthritis, unspecified: Secondary | ICD-10-CM | POA: Diagnosis not present

## 2021-07-11 NOTE — Assessment & Plan Note (Signed)
-  Stable. -Continue current medication regimen.  -Will continue to monitor. 

## 2021-07-11 NOTE — Progress Notes (Signed)
Established Patient Office Visit  Subjective:  Patient ID: Robyn Roberts, female    DOB: December 25, 1955  Age: 65 y.o. MRN: [id number]  CC:  Chief Complaint  Patient presents with   Follow-up    Insomnia, OA     HPI Robyn Roberts presents for follow up on insomnia, GERD and osteoarthritis. Patient has no acute concerns.  Insomnia: Patient reports no changes in sleep. Takes trazodone everyday. 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 States when she does not take trazodone she has more trouble falling asleep.   GERD: Patient takes famotidine daily. States heartburn symptoms are controlled.   Osteoarthritis: Patient reports takes meloxicam daily. Does report her knees have been bothering her more than usual.     Past Medical History:  Diagnosis Date   Arthritis    Chronic back pain    Scoliosis     Past Surgical History:  Procedure Laterality Date   CESAREAN SECTION  1991   FOOT SURGERY Bilateral 1985   bunionectomy    Family History  Problem Relation Age of Onset   Heart disease Mother    Colon polyps Mother    Cancer Mother        thyroid   Asthma Mother    Cancer Father        colon   Diabetes Father    Depression Father    CVA Father    Breast cancer Paternal Aunt     Social History   Socioeconomic History   Marital status: Married    Spouse name: Not on file   Number of children: Not on file   Years of education: Not on file   Highest education level: Not on file  Occupational History   Not on file  Tobacco Use   Smoking status: Never   Smokeless tobacco: Never  Vaping Use   Vaping Use: Never used  Substance and Sexual Activity   Alcohol use: Yes    Comment: rare   Drug use: No   Sexual activity: Yes  Other Topics Concern   Not on file  Social History Narrative   Not on file   Social Determinants of Health   Financial Resource Strain: Not on file  Food Insecurity: Not on file  Transportation Needs: Not on file   Physical Activity: Not on file  Stress: Not on file  Social Connections: Not on file  Intimate Partner Violence: Not on file    Outpatient Medications Prior to Visit  Medication Sig Dispense Refill   famotidine (PEPCID) 20 MG tablet Take 1 tablet (20 mg total) by mouth at bedtime. 90 tablet 1   loratadine (CLARITIN) 10 MG tablet Take 10 mg by mouth daily.     Melatonin 10 MG TABS Take 1 tablet by mouth as needed. For sleep and get the sustained release tablets to help you stay asleep     meloxicam (MOBIC) 15 MG tablet TAKE 1 TABLET DAILY 90 tablet 1   MULTIPLE VITAMIN PO Take 1 tablet by mouth daily.     olopatadine (PATANOL) 0.1 % ophthalmic solution Place 1 drop into both eyes 2 (two) times daily. 5 mL 2   polyethylene glycol powder (GLYCOLAX/MIRALAX) 17 GM/SCOOP powder Take 17 g by mouth 2 (two) times daily as needed. 3350 g 1   traZODone (DESYREL) 100 MG tablet TAKE ONE AND ONE-HALF TABLETS (150 MG) AT BEDTIME AS NEEDED FOR SLEEP (NEED APPOINTMENT FOR REFILLS) 135 tablet 0  albuterol (VENTOLIN HFA) 108 (90 Base) MCG/ACT inhaler Inhale 2 puffs into the lungs every 6 (six) hours as needed for wheezing or shortness of breath. 1 each 1   methylPREDNISolone (MEDROL) 4 MG TBPK tablet Take by mouth as directed for 6 days 21 tablet 0   No facility-administered medications prior to visit.    Allergies  Allergen Reactions   Aspirin Swelling   Penicillins Swelling   Sulfa Antibiotics Hives    ROS Review of Systems Review of Systems:  A fourteen system review of systems was performed and found to be positive as per HPI.   Objective:    Physical Exam General:  Well Developed, well nourished, appropriate for stated age.  Neuro:  Alert and oriented,  extra-ocular muscles intact  HEENT:  Normocephalic, atraumatic, neck supple, no carotid bruits appreciated  Skin:  no gross rash, warm, pink. Cardiac:  RRR, S1 S2 Respiratory:  CTA B/L w/o wheezing, crackles or rales.  Vascular:  Ext  warm, no cyanosis apprec.; cap RF less 2 sec. No gross edema. Psych:  No HI/SI, judgement and insight good, Euthymic mood. Full Affect.  BP 120/69    Pulse 70    Temp 97.6 F (36.4 C)    Ht '5\' 9"'  (1.753 m)    Wt 188 lb (85.3 kg)    SpO2 98%    BMI 27.76 kg/m  Wt Readings from Last 3 Encounters:  07/11/21 188 lb (85.3 kg)  12/25/20 186 lb 6.4 oz (84.6 kg)  12/06/20 189 lb 3.2 oz (85.8 kg)     Health Maintenance Due  Topic Date Due   Pneumococcal Vaccine 96-37 Years old (1 - PCV) Never done   COVID-19 Vaccine (3 - Moderna risk series) 09/16/2019   INFLUENZA VACCINE  02/18/2021   PAP SMEAR-Modifier  06/14/2021    There are no preventive care reminders to display for this patient.  Lab Results  Component Value Date   TSH 2.720 11/29/2020   Lab Results  Component Value Date   WBC 10.4 11/29/2020   HGB 14.1 11/29/2020   HCT 41.4 11/29/2020   MCV 89 11/29/2020   PLT 248 11/29/2020   Lab Results  Component Value Date   NA 141 11/29/2020   K 4.4 11/29/2020   CO2 22 11/29/2020   GLUCOSE 97 11/29/2020   BUN 20 11/29/2020   CREATININE 0.77 11/29/2020   BILITOT 0.5 11/29/2020   ALKPHOS 91 11/29/2020   AST 21 11/29/2020   ALT 29 11/29/2020   PROT 6.2 11/29/2020   ALBUMIN 4.6 11/29/2020   CALCIUM 9.3 11/29/2020   ANIONGAP 2 (L) 12/05/2013   EGFR 87 11/29/2020   Lab Results  Component Value Date   CHOL 125 11/29/2020   Lab Results  Component Value Date   HDL 52 11/29/2020   Lab Results  Component Value Date   LDLCALC 55 11/29/2020   Lab Results  Component Value Date   TRIG 95 11/29/2020   Lab Results  Component Value Date   CHOLHDL 2.4 11/29/2020   Lab Results  Component Value Date   HGBA1C 6.0 (H) 11/29/2020      Assessment & Plan:   Problem List Items Addressed This Visit       Digestive   Gastroesophageal reflux disease    -Stable. -Continue current medication regimen. -Will continue to monitor.        Musculoskeletal and Integument    Generalized OA- hands, back, neck, hips, knees (Chronic)    -Stable. -If knee pain  fails to improve or worsen recommend obtaining x-ray to evaluate for progressive degenerative disease. Recommend to continue with H2 blocker therapy with meloxicam. Discussed exercises and stretches. -Will continue to monitor.        Other   Insomnia - Primary (Chronic)    -Stable. -Continue current medication regimen. -Will continue to monitor.      NSAID long-term use    No orders of the defined types were placed in this encounter.   Follow-up: Return in about 6 months (around 01/09/2022) for CPE and FBW few days prior .    Lorrene Reid, PA-C

## 2021-07-11 NOTE — Assessment & Plan Note (Signed)
-  Stable. -If knee pain fails to improve or worsen recommend obtaining x-ray to evaluate for progressive degenerative disease. Recommend to continue with H2 blocker therapy with meloxicam. Discussed exercises and stretches. -Will continue to monitor.

## 2021-07-11 NOTE — Patient Instructions (Signed)
Arthritis Arthritis is a term that is commonly used to refer to joint pain or jointdisease. There are more than 100 types of arthritis. What are the causes? The most common cause of this condition is wear and tear of a joint. Other causes include: Gout. Inflammation of a joint. An infection of a joint. Sprains and other injuries near the joint. A reaction to medicines or drugs, or an allergic reaction. In some cases, the cause may not be known. What are the signs or symptoms? The main symptom of this condition is pain in the joint during movement. Other symptoms include: Redness, swelling, or stiffness at a joint. Warmth coming from the joint. Fever. Overall feeling of illness. How is this diagnosed? This condition may be diagnosed with a physical exam and tests, including: Blood tests. Urine tests. Imaging tests, such as X-rays, an MRI, or a CT scan. Sometimes, fluid is removed from a joint for testing. How is this treated? This condition may be treated with: Treatment of the cause, if it is known. Rest. Raising (elevating) the joint. Applying cold or hot packs to the joint. Medicines to improve symptoms and reduce inflammation. Injections of a steroid such as cortisone into the joint to help reduce pain and inflammation. Depending on the cause of your arthritis, you may need to make lifestyle changes to reduce stress on your joint. Changes may include: Exercising more. Losing weight. Follow these instructions at home: Medicines Take over-the-counter and prescription medicines only as told by your health care provider. Do not take aspirin to relieve pain if your health care provider thinks that gout may be causing your pain. Activity Rest your joint if told by your health care provider. Rest is important when your disease is active and your joint feels painful, swollen, or stiff. Avoid activities that make the pain worse. It is important to balance activity with  rest. Exercise your joint regularly with range-of-motion exercises as told by your health care provider. Try doing low-impact exercise, such as: Swimming. Water aerobics. Biking. Walking. Managing pain, stiffness, and swelling     If directed, put ice on the joint. Put ice in a plastic bag. Place a towel between your skin and the bag. Leave the ice on for 20 minutes, 2-3 times per day. If your joint is swollen, raise (elevate) it above the level of your heart if directed by your health care provider. If your joint feels stiff in the morning, try taking a warm shower. If directed, apply heat to the affected area as often as told by your health care provider. Use the heat source that your health care provider recommends, such as a moist heat pack or a heating pad. If you have diabetes, do not apply heat without permission from your health care provider. To apply heat: Place a towel between your skin and the heat source. Leave the heat on for 20-30 minutes. Remove the heat if your skin turns bright red. This is especially important if you are unable to feel pain, heat, or cold. You may have a greater risk of getting burned. General instructions Do not use any products that contain nicotine or tobacco, such as cigarettes, e-cigarettes, and chewing tobacco. If you need help quitting, ask your health care provider. Keep all follow-up visits as told by your health care provider. This is important. Contact a health care provider if: The pain gets worse. You have a fever. Get help right away if: You develop severe joint pain, swelling, or redness. Many joints   become painful and swollen. You develop severe back pain. You develop severe weakness in your leg. You cannot control your bladder or bowels. Summary Arthritis is a term that is commonly used to refer to joint pain or joint disease. There are more than 100 types of arthritis. The most common cause of this condition is wear and tear of a  joint. Other causes include gout, inflammation or infection of the joint, sprains, or allergies. Symptoms of this condition include redness, swelling, or stiffness of the joint. Other symptoms include warmth, fever, or feeling ill. This condition is treated with rest, elevation, medicines, and applying cold or hot packs. Follow your health care provider's instructions about medicines, activity, exercises, and other home care treatments. This information is not intended to replace advice given to you by your health care provider. Make sure you discuss any questions you have with your healthcare provider. Document Revised: 06/14/2018 Document Reviewed: 06/14/2018 [employer] Patient Education  2022 [employer]  

## 2021-07-15 ENCOUNTER — Other Ambulatory Visit: Payer: Self-pay | Admitting: Physician Assistant

## 2021-07-15 DIAGNOSIS — Z791 Long term (current) use of non-steroidal anti-inflammatories (NSAID): Secondary | ICD-10-CM

## 2021-07-15 DIAGNOSIS — K219 Gastro-esophageal reflux disease without esophagitis: Secondary | ICD-10-CM

## 2021-07-17 ENCOUNTER — Other Ambulatory Visit: Payer: Self-pay | Admitting: Physician Assistant

## 2021-07-17 DIAGNOSIS — G4700 Insomnia, unspecified: Secondary | ICD-10-CM

## 2021-07-26 ENCOUNTER — Other Ambulatory Visit: Payer: Self-pay | Admitting: Physician Assistant

## 2021-07-26 DIAGNOSIS — Z1231 Encounter for screening mammogram for malignant neoplasm of breast: Secondary | ICD-10-CM

## 2021-08-22 ENCOUNTER — Ambulatory Visit: Payer: BC Managed Care – PPO

## 2021-08-29 ENCOUNTER — Ambulatory Visit: Payer: BC Managed Care – PPO

## 2021-09-12 ENCOUNTER — Ambulatory Visit
Admission: RE | Admit: 2021-09-12 | Discharge: 2021-09-12 | Disposition: A | Discharge status: Home / Self Care | Payer: BC Managed Care – PPO | Source: Ambulatory Visit | Attending: Physician Assistant | Admitting: Physician Assistant

## 2021-09-12 DIAGNOSIS — Z1231 Encounter for screening mammogram for malignant neoplasm of breast: Secondary | ICD-10-CM

## 2021-10-07 ENCOUNTER — Encounter: Payer: Self-pay | Admitting: Physician Assistant

## 2021-11-11 ENCOUNTER — Other Ambulatory Visit: Payer: Self-pay | Admitting: Physician Assistant

## 2021-11-11 DIAGNOSIS — M159 Polyosteoarthritis, unspecified: Secondary | ICD-10-CM

## 2021-11-28 ENCOUNTER — Encounter: Payer: Self-pay | Admitting: Emergency Medicine

## 2021-11-28 ENCOUNTER — Inpatient Hospital Stay (HOSPITAL_COMMUNITY)
Admission: EM | Admit: 2021-11-28 | Discharge: 2021-12-01 | DRG: 179 | Disposition: A | Discharge status: Home / Self Care | Payer: BC Managed Care – PPO | Attending: Family Medicine | Admitting: Family Medicine

## 2021-11-28 ENCOUNTER — Emergency Department (HOSPITAL_COMMUNITY): Payer: BC Managed Care – PPO

## 2021-11-28 ENCOUNTER — Ambulatory Visit
Admission: EM | Admit: 2021-11-28 | Discharge: 2021-11-28 | Disposition: A | Discharge status: Other Acute Inpatient Hospital | Payer: BC Managed Care – PPO

## 2021-11-28 ENCOUNTER — Other Ambulatory Visit: Payer: Self-pay

## 2021-11-28 ENCOUNTER — Encounter (HOSPITAL_COMMUNITY): Payer: Self-pay

## 2021-11-28 DIAGNOSIS — K4090 Unilateral inguinal hernia, without obstruction or gangrene, not specified as recurrent: Secondary | ICD-10-CM | POA: Diagnosis not present

## 2021-11-28 DIAGNOSIS — R0789 Other chest pain: Secondary | ICD-10-CM | POA: Diagnosis not present

## 2021-11-28 DIAGNOSIS — Z880 Allergy status to penicillin: Secondary | ICD-10-CM | POA: Diagnosis not present

## 2021-11-28 DIAGNOSIS — R7302 Impaired glucose tolerance (oral): Secondary | ICD-10-CM | POA: Diagnosis not present

## 2021-11-28 DIAGNOSIS — J9811 Atelectasis: Secondary | ICD-10-CM | POA: Diagnosis not present

## 2021-11-28 DIAGNOSIS — G8929 Other chronic pain: Secondary | ICD-10-CM | POA: Diagnosis not present

## 2021-11-28 DIAGNOSIS — B961 Klebsiella pneumoniae [K. pneumoniae] as the cause of diseases classified elsewhere: Secondary | ICD-10-CM | POA: Diagnosis not present

## 2021-11-28 DIAGNOSIS — R1031 Right lower quadrant pain: Secondary | ICD-10-CM | POA: Diagnosis not present

## 2021-11-28 DIAGNOSIS — M545 Low back pain, unspecified: Secondary | ICD-10-CM | POA: Diagnosis present

## 2021-11-28 DIAGNOSIS — B9689 Other specified bacterial agents as the cause of diseases classified elsewhere: Secondary | ICD-10-CM | POA: Diagnosis present

## 2021-11-28 DIAGNOSIS — R1011 Right upper quadrant pain: Secondary | ICD-10-CM

## 2021-11-28 DIAGNOSIS — J189 Pneumonia, unspecified organism: Secondary | ICD-10-CM | POA: Diagnosis not present

## 2021-11-28 DIAGNOSIS — Z886 Allergy status to analgesic agent status: Secondary | ICD-10-CM | POA: Diagnosis not present

## 2021-11-28 DIAGNOSIS — J850 Gangrene and necrosis of lung: Secondary | ICD-10-CM | POA: Diagnosis not present

## 2021-11-28 DIAGNOSIS — Z882 Allergy status to sulfonamides status: Secondary | ICD-10-CM

## 2021-11-28 DIAGNOSIS — Z79899 Other long term (current) drug therapy: Secondary | ICD-10-CM | POA: Diagnosis not present

## 2021-11-28 DIAGNOSIS — Z791 Long term (current) use of non-steroidal anti-inflammatories (NSAID): Secondary | ICD-10-CM

## 2021-11-28 DIAGNOSIS — K760 Fatty (change of) liver, not elsewhere classified: Secondary | ICD-10-CM | POA: Diagnosis present

## 2021-11-28 DIAGNOSIS — K5730 Diverticulosis of large intestine without perforation or abscess without bleeding: Secondary | ICD-10-CM | POA: Diagnosis not present

## 2021-11-28 DIAGNOSIS — J984 Other disorders of lung: Secondary | ICD-10-CM | POA: Diagnosis not present

## 2021-11-28 DIAGNOSIS — R918 Other nonspecific abnormal finding of lung field: Secondary | ICD-10-CM | POA: Diagnosis not present

## 2021-11-28 LAB — COMPREHENSIVE METABOLIC PANEL
ALT: 22 U/L (ref 0–44)
AST: 17 U/L (ref 15–41)
Albumin: 3.7 g/dL (ref 3.5–5.0)
Alkaline Phosphatase: 70 U/L (ref 38–126)
Anion gap: 10 (ref 5–15)
BUN: 18 mg/dL (ref 8–23)
CO2: 19 mmol/L — ABNORMAL LOW (ref 22–32)
Calcium: 9.3 mg/dL (ref 8.9–10.3)
Chloride: 108 mmol/L (ref 98–111)
Creatinine, Ser: 0.92 mg/dL (ref 0.44–1.00)
GFR, Estimated: >60 mL/min (ref >60)
Glucose, Bld: 128 mg/dL — ABNORMAL HIGH (ref 70–99)
Potassium: 3.6 mmol/L (ref 3.5–5.1)
Sodium: 137 mmol/L (ref 135–145)
Total Bilirubin: 1.3 mg/dL — ABNORMAL HIGH (ref 0.3–1.2)
Total Protein: 6.8 g/dL (ref 6.5–8.1)

## 2021-11-28 LAB — URINALYSIS, ROUTINE W REFLEX MICROSCOPIC
Bilirubin Urine: NEGATIVE
Glucose, UA: 50 mg/dL — AB
Ketones, ur: 20 mg/dL — AB
Leukocytes,Ua: NEGATIVE
Nitrite: NEGATIVE
Protein, ur: 100 mg/dL — AB
Specific Gravity, Urine: 1.045 — ABNORMAL HIGH (ref 1.005–1.030)
pH: 5 (ref 5.0–8.0)

## 2021-11-28 LAB — TROPONIN I (HIGH SENSITIVITY)
Troponin I (High Sensitivity): 3 ng/L (ref <18)
Troponin I (High Sensitivity): 3 ng/L (ref <18)

## 2021-11-28 LAB — CBC WITH DIFFERENTIAL/PLATELET
Abs Immature Granulocytes: 1.52 10*3/uL — ABNORMAL HIGH (ref 0.00–0.07)
Basophils Absolute: 0.1 10*3/uL (ref 0.0–0.1)
Basophils Relative: 0 %
Eosinophils Absolute: 0 10*3/uL (ref 0.0–0.5)
Eosinophils Relative: 0 %
HCT: 42.6 % (ref 36.0–46.0)
Hemoglobin: 14.1 g/dL (ref 12.0–15.0)
Immature Granulocytes: 5 %
Lymphocytes Relative: 3 %
Lymphs Abs: 0.9 10*3/uL (ref 0.7–4.0)
MCH: 30.5 pg (ref 26.0–34.0)
MCHC: 33.1 g/dL (ref 30.0–36.0)
MCV: 92.2 fL (ref 80.0–100.0)
Monocytes Absolute: 1.8 10*3/uL — ABNORMAL HIGH (ref 0.1–1.0)
Monocytes Relative: 5 %
Neutro Abs: 29.5 10*3/uL — ABNORMAL HIGH (ref 1.7–7.7)
Neutrophils Relative %: 87 %
Platelets: 209 10*3/uL (ref 150–400)
RBC: 4.62 MIL/uL (ref 3.87–5.11)
RDW: 13.1 % (ref 11.5–15.5)
WBC: 33.8 10*3/uL — ABNORMAL HIGH (ref 4.0–10.5)
nRBC: 0 % (ref 0.0–0.2)

## 2021-11-28 LAB — LIPASE, BLOOD: Lipase: 24 U/L (ref 11–51)

## 2021-11-28 LAB — LACTIC ACID, PLASMA: Lactic Acid, Venous: 1.6 mmol/L (ref 0.5–1.9)

## 2021-11-28 MED ORDER — LEVOFLOXACIN IN D5W 500 MG/100ML IV SOLN
500.0000 mg | Freq: Once | INTRAVENOUS | Status: AC
  Administered 2021-11-28: 500 mg via INTRAVENOUS
  Filled 2021-11-28: qty 100

## 2021-11-28 MED ORDER — FENTANYL CITRATE PF 50 MCG/ML IJ SOSY
50.0000 ug | PREFILLED_SYRINGE | Freq: Once | INTRAMUSCULAR | Status: AC
  Administered 2021-11-28: 50 ug via INTRAVENOUS
  Filled 2021-11-28: qty 1

## 2021-11-28 MED ORDER — IOHEXOL 300 MG/ML  SOLN
100.0000 mL | Freq: Once | INTRAMUSCULAR | Status: AC | PRN
  Administered 2021-11-28: 100 mL via INTRAVENOUS

## 2021-11-28 MED ORDER — HYDROMORPHONE HCL 1 MG/ML IJ SOLN
0.5000 mg | Freq: Once | INTRAMUSCULAR | Status: AC
  Administered 2021-11-28: 0.5 mg via INTRAVENOUS
  Filled 2021-11-28: qty 1

## 2021-11-28 MED ORDER — SODIUM CHLORIDE (PF) 0.9 % IJ SOLN
INTRAMUSCULAR | Status: AC
  Filled 2021-11-28: qty 50

## 2021-11-28 NOTE — ED Provider Notes (Addendum)
?[facility] ? ? ? ?CSN: [id number] ?Arrival date & time: 11/28/21  1818 ? ? ?  ? ?History   ?Chief Complaint ?Chief Complaint  ?Patient presents with  ? Flank Pain  ? ? ?HPI ?Robyn Roberts is a 66 y.o. female presenting with right-sided abdominal and flank pain radiating upward for about about 1 day.  History IBS, GERD.  States that she is constipated, last bowel movement was about 5 days ago and she is no longer passing gas.  Patient denies nausea, vomiting.  Ibuprofen helps somewhat with the pain.  Denies prior history of abdominal procedures or surgeries including cholecystectomy. ? ?HPI ? ?Past Medical History:  ?Diagnosis Date  ? Arthritis   ? Chronic back pain   ? Scoliosis   ? ? ?Patient Active Problem List  ? Diagnosis Date Noted  ? Upper respiratory tract infection due to COVID-19 virus 12/25/2020  ? Cough 12/25/2020  ? Seborrheic keratosis 12/06/2020  ? Gastroesophageal reflux disease 11/15/2019  ? Allergic conjunctivitis of both eyes 11/15/2019  ? Irritable bowel syndrome with constipation 05/12/2019  ? High risk medication use 11/08/2017  ? NSAID long-term use 11/08/2017  ? Vitamin D deficiency 10/29/2017  ? Glucose intolerance (impaired glucose tolerance) 10/29/2017  ? Perimenopausal- last mense 2009 05/19/2016  ? Osteoarthritis of carpometacarpal [facility]) joint of both thumbs L>er R 05/19/2016  ? Arthritis- b/l knees 05/19/2016  ? Environmental and seasonal allergies 04/05/2016  ? Generalized OA- hands, back, neck, hips, knees 04/05/2016  ? Overweight (BMI 25.0-29.9) 04/05/2016  ? Idiopathic scoliosis 04/03/2016  ? Family history of colon cancer- father age late 4's 04/03/2016  ? Chronic back pain 05/03/2015  ? Insomnia 12/10/2005  ? Difficulty hearing 11/12/2005  ? ? ?Past Surgical History:  ?Procedure Laterality Date  ? CESAREAN SECTION  1991  ? FOOT SURGERY Bilateral 1985  ? bunionectomy  ? ? ?OB History   ?No obstetric history on file. ?  ? ? ? ?Home Medications   ? ?Prior to Admission  medications   ?Medication Sig Start Date End Date Taking? Authorizing Provider  ?famotidine (PEPCID) 20 MG tablet TAKE 1 TABLET AT BEDTIME 07/16/21  Yes Abonza, Maritza, PA-C  ?loratadine (CLARITIN) 10 MG tablet Take 10 mg by mouth daily.   Yes [provider]  ?Melatonin 10 MG TABS Take 1 tablet by mouth as needed. For sleep and get the sustained release tablets to help you stay asleep 05/12/19  Yes Opalski, Gavin Pound, DO  ?meloxicam (MOBIC) 15 MG tablet TAKE 1 TABLET DAILY 11/11/21  Yes Abonza, Maritza, PA-C  ?MULTIPLE VITAMIN PO Take 1 tablet by mouth daily.   Yes [provider]  ?olopatadine (PATANOL) 0.1 % ophthalmic solution Place 1 drop into both eyes 2 (two) times daily. 12/06/20  Yes Abonza, Kandis Cocking, PA-C  ?polyethylene glycol powder (GLYCOLAX/MIRALAX) 17 GM/SCOOP powder Take 17 g by mouth 2 (two) times daily as needed. 11/15/19  Yes Opalski, Gavin Pound, DO  ?traZODone (DESYREL) 100 MG tablet TAKE ONE AND ONE-HALF TABLETS (150 MG) AT BEDTIME AS NEEDED FOR SLEEP. 07/17/21  Yes Mayer Masker, PA-C  ? ? ?Family History ?Family History  ?Problem Relation Age of Onset  ? Heart disease Mother   ? Colon polyps Mother   ? Cancer Mother   ?     thyroid  ? Asthma Mother   ? Cancer Father   ?     colon  ? Diabetes Father   ? Depression Father   ? CVA Father   ? Breast  cancer Paternal Aunt   ? ? ?Social History ?Social History  ? ?Tobacco Use  ? Smoking status: Never  ? Smokeless tobacco: Never  ?Vaping Use  ? Vaping Use: Never used  ?Substance Use Topics  ? Alcohol use: Yes  ?  Comment: rare  ? Drug use: No  ? ? ? ?Allergies   ?Aspirin, Penicillins, and Sulfa antibiotics ? ? ?Review of Systems ?Review of Systems  ?Gastrointestinal:  Positive for abdominal pain.  ?All other systems reviewed and are negative. ? ? ?Physical Exam ?Triage Vital Signs ?ED Triage Vitals  ?Enc Vitals Group  ?   BP 11/28/21 1828 103/65  ?   Pulse Rate 11/28/21 1828 (!) 104  ?   Resp 11/28/21 1828 20  ?   Temp 11/28/21 1828 98.1 ?F  (36.7 ?C)  ?   Temp Source 11/28/21 1828 Oral  ?   SpO2 11/28/21 1828 91 %  ?   Weight 11/28/21 1829 185 lb (83.9 kg)  ?   Height 11/28/21 1829 5\' 9"  (1.753 m)  ?   Head Circumference --   ?   Peak Flow --   ?   Pain Score 11/28/21 1829 8  ?   Pain Loc --   ?   Pain Edu? --   ?   Excl. in GC? --   ? ?No data found. ? ?Updated Vital Signs ?BP 103/65 (BP Location: Left Arm)   Pulse (!) 104   Temp 98.1 ?F (36.7 ?C) (Oral)   Resp 20   Ht 5\' 9"  (1.753 m)   Wt 185 lb (83.9 kg)   SpO2 91%   BMI 27.32 kg/m?  ? ?Visual Acuity ?Right Eye Distance:   ?Left Eye Distance:   ?Bilateral Distance:   ? ?Right Eye Near:   ?Left Eye Near:    ?Bilateral Near:    ? ?Physical Exam ?Vitals reviewed.  ?Constitutional:   ?   General: She is not in acute distress. ?   Appearance: Normal appearance. She is not ill-appearing.  ?HENT:  ?   Head: Normocephalic and atraumatic.  ?   Mouth/Throat:  ?   Mouth: Mucous membranes are moist.  ?   Comments: Moist mucous membranes ?Eyes:  ?   Extraocular Movements: Extraocular movements intact.  ?   Pupils: Pupils are equal, round, and reactive to light.  ?Cardiovascular:  ?   Rate and Rhythm: Regular rhythm. Tachycardia present.  ?   Heart sounds: Normal heart sounds.  ?Pulmonary:  ?   Effort: Pulmonary effort is normal.  ?   Breath sounds: Normal breath sounds. No wheezing, rhonchi or rales.  ?Abdominal:  ?   General: Bowel sounds are decreased. There is no distension.  ?   Palpations: Abdomen is soft. There is no mass.  ?   Tenderness: There is abdominal tenderness in the right upper quadrant. There is no right CVA tenderness, left CVA tenderness, guarding or rebound. Positive signs include Murphy's sign. Negative signs include Rovsing's sign and McBurney's sign.  ?   Comments: Bowel sounds decreased in the lower quadrants.  Generalized abdominal tenderness to palpation, worst in the right upper quadrant.  Positive Murphy sign.  Uncomfortable throughout exam.  ?Skin: ?   General: Skin is warm.   ?   Capillary Refill: Capillary refill takes less than 2 seconds.  ?   Comments: Good skin turgor  ?Neurological:  ?   General: No focal deficit present.  ?   Mental Status: She is alert  and oriented to person, place, and time.  ?Psychiatric:     ?   Mood and Affect: Mood normal.     ?   Behavior: Behavior normal.  ? ? ? ?UC Treatments / Results  ?Labs ?(all labs ordered are listed, but only abnormal results are displayed) ?Labs Reviewed - No data to display ? ?EKG ? ? ?Radiology ?No results found. ? ?Procedures ?Procedures (including critical care time) ? ?Medications Ordered in UC ?Medications - No data to display ? ?Initial Impression / Assessment and Plan / UC Course  ?I have reviewed the triage vital signs and the nursing notes. ? ?Pertinent labs & imaging results that were available during my care of the patient were reviewed by me and considered in my medical decision making (see chart for details). ? ?  ? ?This patient is a very pleasant 66 y.o. year old female presenting with constipation and RUQ tenderness x5 days, getting worse over last day. Tachy but afebrile, oxygenating poorly on room air at 91%. Significant pain to palpation with decreased BS in lower quadrants. Sent to ED via POV, she is in agreement.  ? ?Level 5 as this patient was admitted for CAP. ? ?Final Clinical Impressions(s) / UC Diagnoses  ? ?Final diagnoses:  ?RUQ pain  ? ? ? ?Discharge Instructions   ? ?  ?-Please head straight to [facility]. I'm concerned you have gallbladder disease of a bowel obstruction. ? ? ? ? ?ED Prescriptions   ?None ?  ? ?PDMP not reviewed this encounter. ?  ?Rhys Martini, PA-C ?11/28/21 1859 ? ?  ?Rhys Martini, PA-C ?12/02/21 0757 ? ?

## 2021-11-28 NOTE — ED Provider Triage Note (Signed)
Emergency Medicine Provider Triage Evaluation Note ? ?Robyn Roberts , a 66 y.o. female  was evaluated in triage.  Pt complains of shortness of breath.  Patient was sent from urgent care with concern of right upper quadrant pain and possible cholecystitis versus IBS or constipation.  She states that last night around 8 PM she began having right-sided chest pain.  She states that this happened right after she ate dinner but she denies ever having nausea or vomiting.  She states that since then she has had difficulty taking a deep breath and when she does she has sharp pleuritic pain on the right side.  She does state that the pain is under her right ribs as well is up in her right neck.  She denies any fevers, diarrhea, current nausea.  She denies history of DVT or PE.  She denies recent travel, estrogen use, tobacco use.. ? ?Review of Systems  ?Positive: See above ?Negative:  ? ?Physical Exam  ?BP 119/65 (BP Location: Left Arm)   Pulse 98   Temp 98.4 ?F (36.9 ?C) (Oral)   Resp 16   Ht 5\' 9"  (1.753 m)   Wt 83.9 kg   SpO2 96%   BMI 27.32 kg/m?  ?Gen:   Awake, ill-appearing ?Resp:  Tachypneic, shallow breathing, decreased lung sounds on the right ?MSK:   Moves extremities without difficulty  ?Other:  No true Murphy's sign.  Her abdomen is nontender. ? ?Medical Decision Making  ?Medically screening exam initiated at 7:43 PM.  Appropriate orders placed.  Robyn Roberts was informed that the remainder of the evaluation will be completed by another provider, this initial triage assessment does not replace that evaluation, and the importance of remaining in the ED until their evaluation is complete. ? ?Although she was sent for right upper quadrant pain and possible gallbladder issue, on my exam it does not appear to be her right upper quadrant as much as her right lower lung field as well as her right chest wall. ?  ?Robyn Lento, PA-C ?11/28/21 1945 ? ?

## 2021-11-28 NOTE — ED Notes (Signed)
Pt. Made aware for the need of urine specimen. 

## 2021-11-28 NOTE — ED Triage Notes (Addendum)
Pt reports with RUQ pain that started at 8 pm last night. She states that the pain radiates into her right shoulder. Pt reports having IBS and has not had a bowel movement in 5 days.  ?

## 2021-11-28 NOTE — ED Triage Notes (Signed)
Patient c/o right sided flank pain that radiates up her arm, shoulder into her neck since last night.  It is hard for patient to take a deep breath.  Patient has taken Ibuprofen for pain. ?

## 2021-11-28 NOTE — ED Provider Notes (Signed)
?[facility]-EMERGENCY DEPT ?Provider Note ? ? ?CSN: [id number] ?Arrival date & time: 11/28/21  1914 ? ?  ? ?History ? ?Chief Complaint  ?Patient presents with  ? Abdominal Pain  ? ? ?Robyn Roberts is a 66 y.o. female. ? ? ?Abdominal Pain ?Associated symptoms: chest pain and shortness of breath   ?Patient presents with potential abdominal pain.  Reportedly has had right-sided chest pain.  No dysuria.  Decreased oral intake.  Pain is upper chest to the back and up to the neck.  Seen at urgent care and sent in for potential intra-abdominal pathology.  Only mild abdominal tenderness here.  Decreased bowel movements. ?  ?Past Medical History:  ?Diagnosis Date  ? Arthritis   ? Chronic back pain   ? Scoliosis   ? ?Past Surgical History:  ?Procedure Laterality Date  ? CESAREAN SECTION  1991  ? FOOT SURGERY Bilateral 1985  ? bunionectomy  ? ? ? ?Home Medications ?Prior to Admission medications   ?Medication Sig Start Date End Date Taking? Authorizing Provider  ?famotidine (PEPCID) 20 MG tablet TAKE 1 TABLET AT BEDTIME 07/16/21   Abonza, Maritza, PA-C  ?loratadine (CLARITIN) 10 MG tablet Take 10 mg by mouth daily.    [provider]  ?Melatonin 10 MG TABS Take 1 tablet by mouth as needed. For sleep and get the sustained release tablets to help you stay asleep 05/12/19   Thomasene Lotpalski, Deborah, DO  ?meloxicam (MOBIC) 15 MG tablet TAKE 1 TABLET DAILY 11/11/21   Mayer MaskerAbonza, Maritza, PA-C  ?MULTIPLE VITAMIN PO Take 1 tablet by mouth daily.    [provider]  ?olopatadine (PATANOL) 0.1 % ophthalmic solution Place 1 drop into both eyes 2 (two) times daily. 12/06/20   Mayer MaskerAbonza, Maritza, PA-C  ?polyethylene glycol powder (GLYCOLAX/MIRALAX) 17 GM/SCOOP powder Take 17 g by mouth 2 (two) times daily as needed. 11/15/19   Thomasene Lotpalski, Deborah, DO  ?traZODone (DESYREL) 100 MG tablet TAKE ONE AND ONE-HALF TABLETS (150 MG) AT BEDTIME AS NEEDED FOR SLEEP. 07/17/21   Mayer MaskerAbonza, Maritza, PA-C  ?   ? ?Allergies    ?Aspirin,  Penicillins, and Sulfa antibiotics   ? ?Review of Systems   ?Review of Systems  ?Constitutional:  Negative for appetite change.  ?Respiratory:  Positive for shortness of breath.   ?Cardiovascular:  Positive for chest pain.  ?Gastrointestinal:  Positive for abdominal pain.  ?Skin:  Negative for rash.  ?Neurological:  Negative for weakness.  ? ?Physical Exam ?Updated Vital Signs ?BP (!) 142/87   Pulse 99   Temp 98.4 ?F (36.9 ?C) (Oral)   Resp (!) 21   Ht 5\' 9"  (1.753 m)   Wt 83.9 kg   SpO2 95%   BMI 27.32 kg/m?  ?Physical Exam ?Vitals and nursing note reviewed.  ?HENT:  ?   Head: Atraumatic.  ?Cardiovascular:  ?   Rate and Rhythm: Normal rate and regular rhythm.  ?Pulmonary:  ?   Breath sounds: No wheezing, rhonchi or rales.  ?Chest:  ?   Chest wall: No tenderness.  ?Abdominal:  ?   Tenderness: There is abdominal tenderness.  ?   Hernia: No hernia is present.  ?   Comments: Mild right upper quadrant tenderness without rebound or guarding.  No hernia palpated.  ?Genitourinary: ?   Comments: No CVA tenderness. ?Skin: ?   General: Skin is warm.  ?Neurological:  ?   Mental Status: She is alert and oriented to person, place, and time.  ? ? ?ED Results /  Procedures / Treatments   ?Labs ?(all labs ordered are listed, but only abnormal results are displayed) ?Labs Reviewed  ?COMPREHENSIVE METABOLIC PANEL - Abnormal; Notable for the following components:  ?    Result Value  ? CO2 19 (*)   ? Glucose, Bld 128 (*)   ? Total Bilirubin 1.3 (*)   ? All other components within normal limits  ?CBC WITH DIFFERENTIAL/PLATELET - Abnormal; Notable for the following components:  ? WBC 33.8 (*)   ? Neutro Abs 29.5 (*)   ? Monocytes Absolute 1.8 (*)   ? Abs Immature Granulocytes 1.52 (*)   ? All other components within normal limits  ?CULTURE, BLOOD (ROUTINE X 2)  ?CULTURE, BLOOD (ROUTINE X 2)  ?LIPASE, BLOOD  ?LACTIC ACID, PLASMA  ?URINALYSIS, ROUTINE W REFLEX MICROSCOPIC  ?LACTIC ACID, PLASMA  ?TROPONIN I (HIGH SENSITIVITY)   ?TROPONIN I (HIGH SENSITIVITY)  ? ? ?EKG ?EKG Interpretation ? ?Date/Time:  Thursday Nov 28 2021 19:50:39 EDT ?Ventricular Rate:  93 ?PR Interval:  150 ?QRS Duration: 75 ?QT Interval:  348 ?QTC Calculation: 433 ?R Axis:   27 ?Text Interpretation: Sinus rhythm Borderline low voltage, extremity leads Confirmed by Benjiman Core [phone]) on 11/28/2021 8:13:40 PM ? ?Radiology ?DG Chest 2 View ? ?Result Date: 11/28/2021 ?CLINICAL DATA:  Shortness of breath.  Chest pain. EXAM: CHEST - 2 VIEW COMPARISON:  Radiograph 12/05/2013 FINDINGS: Lung volumes are low. The heart is normal in size for technique. Subsegmental atelectasis at the left lung base. There is no pulmonary edema, pleural effusion, or pneumothorax. No acute osseous abnormalities are seen. IMPRESSION: Low lung volumes with left basilar atelectasis. Electronically Signed   By: Narda Rutherford M.D.   On: 11/28/2021 20:17  ? ?CT ABDOMEN PELVIS W CONTRAST ? ?Result Date: 11/28/2021 ?CLINICAL DATA:  RLQ abdominal pain (Age >= 14y) EXAM: CT ABDOMEN AND PELVIS WITH CONTRAST TECHNIQUE: Multidetector CT imaging of the abdomen and pelvis was performed using the standard protocol following bolus administration of intravenous contrast. RADIATION DOSE REDUCTION: This exam was performed according to the departmental dose-optimization program which includes automated exposure control, adjustment of the mA and/or kV according to patient size and/or use of iterative reconstruction technique. CONTRAST:  OMNIPAQUE IOHEXOL 300 MG/ML  SOLN COMPARISON:  None Available. FINDINGS: Lower chest: Airspace consolidation in the medial right middle lobe with air bronchograms most consistent with pneumonia. There are hypoventilatory changes within both lung bases. Trace right pleural thickening. Hepatobiliary: Diffusely decreased hepatic density consistent with steatosis. The liver appears elongated spanning 23 cm cranial caudal. No focal liver lesion. Gallbladder physiologically  distended, no calcified stone. No biliary dilatation. Pancreas: No ductal dilatation or inflammation. Spleen: Normal in size without focal abnormality. Adrenals/Urinary Tract: Normal adrenal glands. No hydronephrosis. Bilateral parapelvic cysts. Additional small cortical cyst in the posterior right kidney. No further characterization or follow-up is needed. No renal calculi. Partially distended urinary bladder, small cystocele. No bladder wall thickening. Stomach/Bowel: Unremarkable appearance of the stomach. No small bowel obstruction or inflammation. Normal appendix. Mild sigmoid colonic diverticulosis without diverticulitis or acute colonic inflammation. Vascular/Lymphatic: Normal caliber abdominal aorta, minimal atherosclerosis. Patent portal, splenic, and mesenteric veins. No abdominopelvic adenopathy. Reproductive: Uterus and bilateral adnexa are unremarkable. Other: No free air or ascites. Small bilateral fat containing inguinal hernias. Tiny fat containing umbilical hernia. Musculoskeletal: There are no acute or suspicious osseous abnormalities. IMPRESSION: 1. Right middle lobe pneumonia. Recommend follow-up to resolution, CT follow-up may be needed as this was not well seen on chest radiographs earlier today.  2. No acute abnormality in the abdomen/pelvis. Normal appendix. 3. Hepatic steatosis and hepatomegaly. 4. Mild sigmoid colonic diverticulosis without diverticulitis. 5. Small cystocele. 6. Small bilateral fat containing inguinal hernias and tiny fat containing umbilical hernia. Aortic Atherosclerosis (ICD10-I70.0). Electronically Signed   By: Narda Rutherford M.D.   On: 11/28/2021 23:00   ? ?Procedures ?Procedures  ? ? ?Medications Ordered in ED ?Medications  ?sodium chloride (PF) 0.9 % injection (has no administration in time range)  ?levofloxacin (LEVAQUIN) IVPB 500 mg (has no administration in time range)  ?iohexol (OMNIPAQUE) 300 MG/ML solution 100 mL (100 mLs Intravenous Contrast Given 11/28/21  2208)  ?fentaNYL (SUBLIMAZE) injection 50 mcg (50 mcg Intravenous Given 11/28/21 2217)  ?HYDROmorphone (DILAUDID) injection 0.5 mg (0.5 mg Intravenous Given 11/28/21 2306)  ? ? ?ED Course/ Medical Decision Making/ A&P ?  ?

## 2021-11-28 NOTE — Discharge Instructions (Addendum)
-  Please head straight to [facility]. I'm concerned you have gallbladder disease of a bowel obstruction. ?

## 2021-11-28 NOTE — ED Notes (Addendum)
Patient is being discharged from the Urgent Care and sent to the Emergency Department via private vehicle . Per Ihor Gully patient is in need of higher level of care due to further evaluation. Patient is aware and verbalizes understanding of plan of care.  ?Vitals:  ? 11/28/21 1828  ?BP: 103/65  ?Pulse: (!) 104  ?Resp: 20  ?Temp: 98.1 ?F (36.7 ?C)  ?SpO2: 91%  ?  ?

## 2021-11-29 ENCOUNTER — Inpatient Hospital Stay (HOSPITAL_COMMUNITY): Payer: BC Managed Care – PPO

## 2021-11-29 ENCOUNTER — Encounter (HOSPITAL_COMMUNITY): Payer: Self-pay | Admitting: Internal Medicine

## 2021-11-29 DIAGNOSIS — Z882 Allergy status to sulfonamides status: Secondary | ICD-10-CM | POA: Diagnosis not present

## 2021-11-29 DIAGNOSIS — Z880 Allergy status to penicillin: Secondary | ICD-10-CM | POA: Diagnosis not present

## 2021-11-29 DIAGNOSIS — J189 Pneumonia, unspecified organism: Secondary | ICD-10-CM

## 2021-11-29 DIAGNOSIS — K760 Fatty (change of) liver, not elsewhere classified: Secondary | ICD-10-CM | POA: Diagnosis present

## 2021-11-29 DIAGNOSIS — B9689 Other specified bacterial agents as the cause of diseases classified elsewhere: Secondary | ICD-10-CM | POA: Diagnosis present

## 2021-11-29 DIAGNOSIS — J850 Gangrene and necrosis of lung: Secondary | ICD-10-CM | POA: Diagnosis present

## 2021-11-29 DIAGNOSIS — B961 Klebsiella pneumoniae [K. pneumoniae] as the cause of diseases classified elsewhere: Secondary | ICD-10-CM | POA: Diagnosis present

## 2021-11-29 DIAGNOSIS — Z791 Long term (current) use of non-steroidal anti-inflammatories (NSAID): Secondary | ICD-10-CM | POA: Diagnosis not present

## 2021-11-29 DIAGNOSIS — M545 Low back pain, unspecified: Secondary | ICD-10-CM | POA: Diagnosis present

## 2021-11-29 DIAGNOSIS — Z79899 Other long term (current) drug therapy: Secondary | ICD-10-CM | POA: Diagnosis not present

## 2021-11-29 DIAGNOSIS — G8929 Other chronic pain: Secondary | ICD-10-CM | POA: Diagnosis present

## 2021-11-29 DIAGNOSIS — Z886 Allergy status to analgesic agent status: Secondary | ICD-10-CM | POA: Diagnosis not present

## 2021-11-29 DIAGNOSIS — R7302 Impaired glucose tolerance (oral): Secondary | ICD-10-CM | POA: Diagnosis present

## 2021-11-29 LAB — BLOOD CULTURE ID PANEL (REFLEXED) - BCID2

## 2021-11-29 LAB — CBC WITH DIFFERENTIAL/PLATELET
Abs Immature Granulocytes: 1.58 10*3/uL — ABNORMAL HIGH (ref 0.00–0.07)
Basophils Absolute: 0.1 10*3/uL (ref 0.0–0.1)
Basophils Relative: 0 %
Eosinophils Absolute: 0 10*3/uL (ref 0.0–0.5)
Eosinophils Relative: 0 %
HCT: 38.8 % (ref 36.0–46.0)
Hemoglobin: 13.4 g/dL (ref 12.0–15.0)
Immature Granulocytes: 5 %
Lymphocytes Relative: 2 %
Lymphs Abs: 0.7 10*3/uL (ref 0.7–4.0)
MCH: 31.7 pg (ref 26.0–34.0)
MCHC: 34.5 g/dL (ref 30.0–36.0)
MCV: 91.7 fL (ref 80.0–100.0)
Monocytes Absolute: 1.4 10*3/uL — ABNORMAL HIGH (ref 0.1–1.0)
Monocytes Relative: 4 %
Neutro Abs: 29.5 10*3/uL — ABNORMAL HIGH (ref 1.7–7.7)
Neutrophils Relative %: 89 %
Platelets: 200 10*3/uL (ref 150–400)
RBC: 4.23 MIL/uL (ref 3.87–5.11)
RDW: 13.1 % (ref 11.5–15.5)
WBC: 33.3 10*3/uL — ABNORMAL HIGH (ref 4.0–10.5)
nRBC: 0 % (ref 0.0–0.2)

## 2021-11-29 LAB — COMPREHENSIVE METABOLIC PANEL
ALT: 20 U/L (ref 0–44)
AST: 15 U/L (ref 15–41)
Albumin: 3.5 g/dL (ref 3.5–5.0)
Alkaline Phosphatase: 70 U/L (ref 38–126)
Anion gap: 8 (ref 5–15)
BUN: 18 mg/dL (ref 8–23)
CO2: 23 mmol/L (ref 22–32)
Calcium: 9.1 mg/dL (ref 8.9–10.3)
Chloride: 105 mmol/L (ref 98–111)
Creatinine, Ser: 0.8 mg/dL (ref 0.44–1.00)
GFR, Estimated: >60 mL/min (ref >60)
Glucose, Bld: 149 mg/dL — ABNORMAL HIGH (ref 70–99)
Potassium: 3.6 mmol/L (ref 3.5–5.1)
Sodium: 136 mmol/L (ref 135–145)
Total Bilirubin: 1.2 mg/dL (ref 0.3–1.2)
Total Protein: 7 g/dL (ref 6.5–8.1)

## 2021-11-29 LAB — HIV ANTIBODY (ROUTINE TESTING W REFLEX): HIV Screen 4th Generation wRfx: NONREACTIVE

## 2021-11-29 LAB — LACTIC ACID, PLASMA: Lactic Acid, Venous: 1.5 mmol/L (ref 0.5–1.9)

## 2021-11-29 LAB — STREP PNEUMONIAE URINARY ANTIGEN: Strep Pneumo Urinary Antigen: NEGATIVE

## 2021-11-29 LAB — CK: Total CK: 32 U/L — ABNORMAL LOW (ref 38–234)

## 2021-11-29 MED ORDER — LEVOFLOXACIN IN D5W 750 MG/150ML IV SOLN
750.0000 mg | INTRAVENOUS | Status: DC
  Administered 2021-11-30 (×2): 750 mg via INTRAVENOUS
  Filled 2021-11-29 (×2): qty 150

## 2021-11-29 MED ORDER — LACTATED RINGERS IV BOLUS
500.0000 mL | Freq: Once | INTRAVENOUS | Status: AC
  Administered 2021-11-29: 500 mL via INTRAVENOUS

## 2021-11-29 MED ORDER — LEVOFLOXACIN IN D5W 750 MG/150ML IV SOLN: 750.0000 mg | INTRAVENOUS | Status: DC

## 2021-11-29 MED ORDER — ENOXAPARIN SODIUM 40 MG/0.4ML IJ SOSY
40.0000 mg | PREFILLED_SYRINGE | INTRAMUSCULAR | Status: DC
  Administered 2021-11-29 – 2021-12-01 (×3): 40 mg via SUBCUTANEOUS
  Filled 2021-11-29 (×3): qty 0.4

## 2021-11-29 MED ORDER — FAMOTIDINE 20 MG PO TABS
20.0000 mg | ORAL_TABLET | Freq: Every day | ORAL | Status: DC
  Administered 2021-11-29 – 2021-11-30 (×2): 20 mg via ORAL
  Filled 2021-11-29 (×2): qty 1

## 2021-11-29 MED ORDER — ONDANSETRON HCL 4 MG PO TABS
4.0000 mg | ORAL_TABLET | Freq: Four times a day (QID) | ORAL | Status: DC | PRN
  Administered 2021-11-29: 4 mg via ORAL
  Filled 2021-11-29: qty 1

## 2021-11-29 MED ORDER — NAPROXEN 250 MG PO TABS
500.0000 mg | ORAL_TABLET | Freq: Two times a day (BID) | ORAL | Status: DC
Start: 2021-11-29 — End: 2021-12-01
  Administered 2021-11-29 – 2021-12-01 (×5): 500 mg via ORAL
  Filled 2021-11-29: qty 2
  Filled 2021-11-29: qty 1
  Filled 2021-11-29 (×2): qty 2
  Filled 2021-11-29: qty 1
  Filled 2021-11-29: qty 2

## 2021-11-29 MED ORDER — ONDANSETRON HCL 4 MG/2ML IJ SOLN
4.0000 mg | Freq: Once | INTRAMUSCULAR | Status: AC
  Administered 2021-11-29: 4 mg via INTRAVENOUS
  Filled 2021-11-29: qty 2

## 2021-11-29 MED ORDER — IOHEXOL 300 MG/ML  SOLN
75.0000 mL | Freq: Once | INTRAMUSCULAR | Status: AC | PRN
  Administered 2021-11-29: 75 mL via INTRAVENOUS

## 2021-11-29 MED ORDER — HYDROMORPHONE HCL 1 MG/ML IJ SOLN
0.5000 mg | INTRAMUSCULAR | Status: DC | PRN
  Administered 2021-11-29 (×2): 0.5 mg via INTRAVENOUS
  Filled 2021-11-29: qty 0.5
  Filled 2021-11-29: qty 1

## 2021-11-29 MED ORDER — OXYCODONE-ACETAMINOPHEN 7.5-325 MG PO TABS
1.0000 | ORAL_TABLET | ORAL | Status: DC | PRN
  Administered 2021-11-29: 1 via ORAL
  Filled 2021-11-29: qty 1

## 2021-11-29 MED ORDER — SODIUM CHLORIDE (PF) 0.9 % IJ SOLN
INTRAMUSCULAR | Status: AC
  Filled 2021-11-29: qty 50

## 2021-11-29 MED ORDER — HYDROMORPHONE HCL 1 MG/ML IJ SOLN
0.5000 mg | Freq: Once | INTRAMUSCULAR | Status: AC
  Administered 2021-11-29: 0.5 mg via INTRAVENOUS
  Filled 2021-11-29: qty 1

## 2021-11-29 MED ORDER — METRONIDAZOLE 500 MG/100ML IV SOLN
500.0000 mg | Freq: Two times a day (BID) | INTRAVENOUS | Status: DC
  Administered 2021-11-29 – 2021-12-01 (×5): 500 mg via INTRAVENOUS
  Filled 2021-11-29 (×5): qty 100

## 2021-11-29 MED ORDER — LACTATED RINGERS IV SOLN: INTRAVENOUS | Status: AC

## 2021-11-29 MED ORDER — ONDANSETRON HCL 4 MG/2ML IJ SOLN
4.0000 mg | Freq: Four times a day (QID) | INTRAMUSCULAR | Status: DC | PRN
  Administered 2021-11-29: 4 mg via INTRAVENOUS
  Filled 2021-11-29: qty 2

## 2021-11-29 MED ORDER — TRAZODONE HCL 50 MG PO TABS
150.0000 mg | ORAL_TABLET | Freq: Every day | ORAL | Status: DC
  Administered 2021-11-29 – 2021-11-30 (×2): 150 mg via ORAL
  Filled 2021-11-29 (×2): qty 1

## 2021-11-29 MED ORDER — MORPHINE SULFATE (PF) 2 MG/ML IV SOLN
1.0000 mg | INTRAVENOUS | Status: DC | PRN
  Administered 2021-11-29 (×2): 1 mg via INTRAVENOUS
  Filled 2021-11-29 (×2): qty 1

## 2021-11-29 NOTE — ED Notes (Signed)
ED TO INPATIENT HANDOFF REPORT ? ?Name/Age/Gender ?Robyn Roberts ?66 y.o. ?female ? ?Code Status ? ?  ?Code Status Orders  ?(From admission, onward)  ?  ? ? ?  ? ?  Start     Ordered  ? 11/29/21 0316  Full code  Continuous       ? 11/29/21 0316  ? ?  ?  ? ?  ? ?Code Status History   ? ? This patient has a current code status but no historical code status.  ? ?  ? ? ?Home/SNF/Other ?Home ? ?Chief Complaint ?CAP (community acquired pneumonia) [J18.9] ? ?Level of Care/Admitting Diagnosis ?ED Disposition   ? ? ED Disposition  ?Admit  ? Condition  ?--  ? Comment  ?Hospital Area: [facility] [[id number]] ? Level of Care: Telemetry [5] ? Admit to tele based on following criteria: Monitor for Ischemic changes ? May admit patient to [facility] or [facility] if equivalent level of care is available:: No ? Covid Evaluation: Asymptomatic - no recent exposure (last 10 days) testing not required ? Diagnosis: CAP (community acquired pneumonia) [673419] ? Admitting Physician: Eduard Clos [phone] ? Attending Physician: Eduard Clos [phone] ? Estimated length of stay: past midnight tomorrow ? Certification:: I certify this patient will need inpatient services for at least 2 midnights ?  ?  ? ?  ? ? ?Medical History ?Past Medical History:  ?Diagnosis Date  ? Arthritis   ? Chronic back pain   ? Scoliosis   ? ? ?Allergies ?Allergies  ?Allergen Reactions  ? Aspirin Swelling  ? Penicillins Swelling  ? Sulfa Antibiotics Hives  ? ? ?IV Location/Drains/Wounds ?Patient Lines/Drains/Airways Status   ? ? Active Line/Drains/Airways   ? ? Name Placement date Placement time Site Days  ? Peripheral IV 11/28/21 20 G Anterior;Left Forearm 11/28/21  2159  Forearm  1  ? Peripheral IV 11/28/21 20 G Anterior;Distal;Right Forearm 11/28/21  2200  Forearm  1  ? ?  ?  ? ?  ? ? ?Labs/Imaging ?Results for orders placed or performed during the hospital encounter of 11/28/21 (from the past 48 hour(s))  ?Comprehensive metabolic  panel     Status: Abnormal  ? Collection Time: 11/28/21  7:32 PM  ?Result Value Ref Range  ? Sodium 137 135 - 145 mmol/L  ? Potassium 3.6 3.5 - 5.1 mmol/L  ? Chloride 108 98 - 111 mmol/L  ? CO2 19 (L) 22 - 32 mmol/L  ? Glucose, Bld 128 (H) 70 - 99 mg/dL  ?  Comment: Glucose reference range applies only to samples taken after fasting for at least 8 hours.  ? BUN 18 8 - 23 mg/dL  ? Creatinine, Ser 0.92 0.44 - 1.00 mg/dL  ? Calcium 9.3 8.9 - 10.3 mg/dL  ? Total Protein 6.8 6.5 - 8.1 g/dL  ? Albumin 3.7 3.5 - 5.0 g/dL  ? AST 17 15 - 41 U/L  ? ALT 22 0 - 44 U/L  ? Alkaline Phosphatase 70 38 - 126 U/L  ? Total Bilirubin 1.3 (H) 0.3 - 1.2 mg/dL  ? GFR, Estimated >60 >60 mL/min  ?  Comment: (NOTE) ?Calculated using the CKD-EPI Creatinine Equation (2021) ?  ? Anion gap 10 5 - 15  ?  Comment: Performed at [facility], [address] [place], [place] [address]  ?Lipase, blood     Status: None  ? Collection Time: 11/28/21  7:32 PM  ?Result Value Ref Range  ?  Lipase 24 11 - 51 U/L  ?  Comment: Performed at [facility], [address], [place], [place] [address]  ?CBC with Diff     Status: Abnormal  ? Collection Time: 11/28/21  7:32 PM  ?Result Value Ref Range  ? WBC 33.8 (H) 4.0 - 10.5 K/uL  ? RBC 4.62 3.87 - 5.11 MIL/uL  ? Hemoglobin 14.1 12.0 - 15.0 g/dL  ? HCT 42.6 36.0 - 46.0 %  ? MCV 92.2 80.0 - 100.0 fL  ? MCH 30.5 26.0 - 34.0 pg  ? MCHC 33.1 30.0 - 36.0 g/dL  ? RDW 13.1 11.5 - 15.5 %  ? Platelets 209 150 - 400 K/uL  ? nRBC 0.0 0.0 - 0.2 %  ? Neutrophils Relative % 87 %  ? Neutro Abs 29.5 (H) 1.7 - 7.7 K/uL  ? Lymphocytes Relative 3 %  ? Lymphs Abs 0.9 0.7 - 4.0 K/uL  ? Monocytes Relative 5 %  ? Monocytes Absolute 1.8 (H) 0.1 - 1.0 K/uL  ? Eosinophils Relative 0 %  ? Eosinophils Absolute 0.0 0.0 - 0.5 K/uL  ? Basophils Relative 0 %  ? Basophils Absolute 0.1 0.0 - 0.1 K/uL  ? Immature Granulocytes 5 %  ? Abs Immature Granulocytes 1.52 (H) 0.00 - 0.07 K/uL  ?  Comment: Performed at  [facility], [address] [place], [place] [address]  ?Troponin I (High Sensitivity)     Status: None  ? Collection Time: 11/28/21  7:46 PM  ?Result Value Ref Range  ? Troponin I (High Sensitivity) 3 <18 ng/L  ?  Comment: (NOTE) ?Elevated high sensitivity troponin I (hsTnI) values and significant  ?changes across serial measurements may suggest ACS but many other  ?chronic and acute conditions are known to elevate hsTnI results.  ?Refer to the "Links" section for chest pain algorithms and additional  ?guidance. ?Performed at [facility], [address] ?[place], [place] [address] ?  ?Lactic acid, plasma     Status: None  ? Collection Time: 11/28/21  9:59 PM  ?Result Value Ref Range  ? Lactic Acid, Venous 1.6 0.5 - 1.9 mmol/L  ?  Comment: Performed at [facility], [address] [place], [place] [address]  ?Troponin I (High Sensitivity)     Status: None  ? Collection Time: 11/28/21  9:59 PM  ?Result Value Ref Range  ? Troponin I (High Sensitivity) 3 <18 ng/L  ?  Comment: (NOTE) ?Elevated high sensitivity troponin I (hsTnI) values and significant  ?changes across serial measurements may suggest ACS but many other  ?chronic and acute conditions are known to elevate hsTnI results.  ?Refer to the "Links" section for chest pain algorithms and additional  ?guidance. ?Performed at [facility], [address] ?[place], [place] [address] ?  ?Urinalysis, Routine w reflex microscopic     Status: Abnormal  ? Collection Time: 11/28/21 11:00 PM  ?Result Value Ref Range  ? Color, Urine YELLOW YELLOW  ? APPearance HAZY (A) CLEAR  ? Specific Gravity, Urine 1.045 (H) 1.005 - 1.030  ? pH 5.0 5.0 - 8.0  ? Glucose, UA 50 (A) NEGATIVE mg/dL  ? Hgb urine dipstick SMALL (A) NEGATIVE  ? Bilirubin Urine NEGATIVE NEGATIVE  ? Ketones, ur 20 (A) NEGATIVE mg/dL  ? Protein, ur 100 (A) NEGATIVE mg/dL  ? Nitrite NEGATIVE NEGATIVE  ? Leukocytes,Ua NEGATIVE NEGATIVE   ? RBC / HPF 6-10 0 - 5 RBC/hpf  ? WBC, UA  21-50 0 - 5 WBC/hpf  ? Bacteria, UA RARE (A) NONE SEEN  ? Squamous Epithelial / LPF 0-5 0 - 5  ? Mucus PRESENT   ?  Comment: Performed at [facility], [address] [place], [place] [address]  ?Lactic acid, plasma     Status: None  ? Collection Time: 11/28/21 11:57 PM  ?Result Value Ref Range  ? Lactic Acid, Venous 1.5 0.5 - 1.9 mmol/L  ?  Comment: Performed at [facility], [address] [place], [place] [address]  ?Comprehensive metabolic panel     Status: Abnormal  ? Collection Time: 11/29/21  3:15 AM  ?Result Value Ref Range  ? Sodium 136 135 - 145 mmol/L  ? Potassium 3.6 3.5 - 5.1 mmol/L  ? Chloride 105 98 - 111 mmol/L  ? CO2 23 22 - 32 mmol/L  ? Glucose, Bld 149 (H) 70 - 99 mg/dL  ?  Comment: Glucose reference range applies only to samples taken after fasting for at least 8 hours.  ? BUN 18 8 - 23 mg/dL  ? Creatinine, Ser 0.80 0.44 - 1.00 mg/dL  ? Calcium 9.1 8.9 - 10.3 mg/dL  ? Total Protein 7.0 6.5 - 8.1 g/dL  ? Albumin 3.5 3.5 - 5.0 g/dL  ? AST 15 15 - 41 U/L  ? ALT 20 0 - 44 U/L  ? Alkaline Phosphatase 70 38 - 126 U/L  ? Total Bilirubin 1.2 0.3 - 1.2 mg/dL  ? GFR, Estimated >60 >60 mL/min  ?  Comment: (NOTE) ?Calculated using the CKD-EPI Creatinine Equation (2021) ?  ? Anion gap 8 5 - 15  ?  Comment: Performed at [facility], [address] [place], [place] [address]  ?CBC with Differential/Platelet     Status: Abnormal  ? Collection Time: 11/29/21  3:15 AM  ?Result Value Ref Range  ? WBC 33.3 (H) 4.0 - 10.5 K/uL  ? RBC 4.23 3.87 - 5.11 MIL/uL  ? Hemoglobin 13.4 12.0 - 15.0 g/dL  ? HCT 38.8 36.0 - 46.0 %  ? MCV 91.7 80.0 - 100.0 fL  ? MCH 31.7 26.0 - 34.0 pg  ? MCHC 34.5 30.0 - 36.0 g/dL  ? RDW 13.1 11.5 - 15.5 %  ? Platelets 200 150 - 400 K/uL  ? nRBC 0.0 0.0 - 0.2 %  ? Neutrophils Relative % 89 %  ? Neutro Abs 29.5 (H) 1.7 - 7.7 K/uL  ? Lymphocytes Relative 2 %  ? Lymphs Abs 0.7 0.7 - 4.0 K/uL  ?  Monocytes Relative 4 %  ? Monocytes Absolute 1.4 (H) 0.1 - 1.0 K/uL  ? Eosinophils Relative 0 %  ? Eosinophils Absolute 0.0 0.0 - 0.5 K/uL  ? Basophils Relative 0 %  ? Basophils Absolute 0.1 0.0 - 0.1 K/uL

## 2021-11-29 NOTE — Plan of Care (Signed)
?  Problem: Clinical Measurements: ?Goal: Ability to maintain a body temperature in the normal range will improve ?Outcome: Progressing ?  ?Problem: Respiratory: ?Goal: Ability to maintain adequate ventilation will improve ?Outcome: Progressing ?Goal: Ability to maintain a clear airway will improve ?Outcome: Progressing ?  ?Problem: Activity: ?Goal: Ability to tolerate increased activity will improve ?Outcome: Progressing ?  ?

## 2021-11-29 NOTE — Hospital Course (Signed)
66 year old white female known history of chronic LBP, right-sided chest pain X24 H-came to ED with pleuritic chest pain ?WBC 33,000, CT ABD = RML PNA ?Empiric antibiotics started ? ?BUN/creatinine 18/0.8, CK 32 ?WBC 33 ?Lactic acid 1.6 ? ?CT chest showed central necrosis ?

## 2021-11-29 NOTE — ED Notes (Signed)
Pt expressed she had a sudden onset of vomiting and nausea, notified hospitalist. ?

## 2021-11-29 NOTE — H&P (Signed)
?History and Physical  ? ? ?Robyn Roberts Sennett ZOX:[id number] DOB: 24-Jul-1955 DOA: 11/28/2021 ? ?PCP: Mayer MaskerAbonza, Maritza, PA-C  ?Patient coming from: Home. ? ?Chief Complaint: Right-sided chest pain. ? ?HPI: Robyn Roberts Boody is a 66 y.o. female with history of chronic back pain presents to the ER because of right-sided chest pain ongoing for the last 48 hours.  Denies any fever chills.  Pain is mostly pleuritic in nature.  Patient denies any recent travel or sick contacts.  Has no productive cough. ? ?ED Course: In the ER patient is having significant right-sided chest pain EKG was showing normal sinus rhythm Labs are showing significant leukocytosis of 33,000 patient was afebrile.  CT abdomen pelvis done shows features concerning for right middle lobe pneumonia.  Patient was started on empiric antibiotics admitted for further work-up.  Blood cultures were obtained. ? ?Review of Systems: As per HPI, rest all negative. ? ? ?Past Medical History:  ?Diagnosis Date  ? Arthritis   ? Chronic back pain   ? Scoliosis   ? ? ?Past Surgical History:  ?Procedure Laterality Date  ? CESAREAN SECTION  1991  ? FOOT SURGERY Bilateral 1985  ? bunionectomy  ? ? ? reports that she has never smoked. She has never used smokeless tobacco. She reports current alcohol use. She reports that she does not use drugs. ? ?Allergies  ?Allergen Reactions  ? Aspirin Swelling  ? Penicillins Swelling  ? Sulfa Antibiotics Hives  ? ? ?Family History  ?Problem Relation Age of Onset  ? Heart disease Mother   ? Colon polyps Mother   ? Cancer Mother   ?     thyroid  ? Asthma Mother   ? Cancer Father   ?     colon  ? Diabetes Father   ? Depression Father   ? CVA Father   ? Breast cancer Paternal Aunt   ? ? ?Prior to Admission medications   ?Medication Sig Start Date End Date Taking? Authorizing Provider  ?famotidine (PEPCID) 20 MG tablet TAKE 1 TABLET AT BEDTIME ?Patient taking differently: Take 20 mg by mouth at bedtime. 07/16/21  Yes Abonza, Kandis CockingMaritza, PA-C   ?ibuprofen (ADVIL) 200 MG tablet Take 600 mg by mouth every 6 (six) hours as needed for moderate pain.   Yes [provider]  ?loratadine (CLARITIN) 10 MG tablet Take 10 mg by mouth daily.   Yes [provider]  ?Melatonin 10 MG TABS Take 1 tablet by mouth as needed. For sleep and get the sustained release tablets to help you stay asleep ?Patient taking differently: Take 10 mg by mouth at bedtime. For sleep and get the sustained release tablets to help you stay asleep 05/12/19  Yes Opalski, Gavin Poundeborah, DO  ?meloxicam (MOBIC) 15 MG tablet TAKE 1 TABLET DAILY 11/11/21  Yes Abonza, Maritza, PA-C  ?MULTIPLE VITAMIN PO Take 1 tablet by mouth daily.   Yes [provider]  ?olopatadine (PATANOL) 0.1 % ophthalmic solution Place 1 drop into both eyes 2 (two) times daily. ?Patient taking differently: Place 1 drop into both eyes daily as needed for allergies. 12/06/20  Yes Abonza, Kandis CockingMaritza, PA-C  ?polyethylene glycol powder (GLYCOLAX/MIRALAX) 17 GM/SCOOP powder Take 17 g by mouth 2 (two) times daily as needed. ?Patient taking differently: Take 17 g by mouth daily. 11/15/19  Yes Opalski, Gavin Poundeborah, DO  ?traZODone (DESYREL) 100 MG tablet TAKE ONE AND ONE-HALF TABLETS (150 MG) AT BEDTIME AS NEEDED FOR SLEEP. ?Patient taking differently: Take 150 mg by mouth at bedtime. TAKE  ONE AND ONE-HALF TABLETS (150 MG) AT BEDTIME AS NEEDED FOR SLEEP. 07/17/21  Yes Mayer Masker, PA-C  ? ? ?Physical Exam: ?Constitutional: Moderately built and nourished. ?Vitals:  ? 11/29/21 0100 11/29/21 0115 11/29/21 0130 11/29/21 0200  ?BP: 105/71  113/73   ?Pulse: 83 89 84 79  ?Resp: (!) 30 (!) 32 (!) 32 (!) 23  ?Temp:      ?TempSrc:      ?SpO2: 93% 95% 95% 94%  ?Weight:      ?Height:      ? ?Eyes: Anicteric no pallor. ?ENMT: No discharge from ears eyes nose and mouth. ?Neck: No mass felt.  No neck rigidity. ?Respiratory: No rhonchi or crepitations. ?Cardiovascular: S1-S2 heard. ?Abdomen: Soft nontender bowel sound  present. ?Musculoskeletal: No edema. ?Skin: No rash. ?Neurologic: Alert awake oriented to time place and person.  Moves all extremities. ?Psychiatric: Appears normal.  Normal affect. ? ? ?Labs on Admission: I have personally reviewed following labs and imaging studies ? ?CBC: ?Recent Labs  ?Lab 11/28/21 ?1932  ?WBC 33.8*  ?NEUTROABS 29.5*  ?HGB 14.1  ?HCT 42.6  ?MCV 92.2  ?PLT 209  ? ?Basic Metabolic Panel: ?Recent Labs  ?Lab 11/28/21 ?1932  ?NA 137  ?K 3.6  ?CL 108  ?CO2 19*  ?GLUCOSE 128*  ?BUN 18  ?CREATININE 0.92  ?CALCIUM 9.3  ? ?GFR: ?Estimated Creatinine Clearance: 71.5 mL/min (by C-G formula based on SCr of 0.92 mg/dL). ?Liver Function Tests: ?Recent Labs  ?Lab 11/28/21 ?1932  ?AST 17  ?ALT 22  ?ALKPHOS 70  ?BILITOT 1.3*  ?PROT 6.8  ?ALBUMIN 3.7  ? ?Recent Labs  ?Lab 11/28/21 ?1932  ?LIPASE 24  ? ?No results for input(s): AMMONIA in the last 168 hours. ?Coagulation Profile: ?No results for input(s): INR, PROTIME in the last 168 hours. ?Cardiac Enzymes: ?No results for input(s): CKTOTAL, CKMB, CKMBINDEX, TROPONINI in the last 168 hours. ?BNP (last 3 results) ?No results for input(s): PROBNP in the last 8760 hours. ?HbA1C: ?No results for input(s): HGBA1C in the last 72 hours. ?CBG: ?No results for input(s): GLUCAP in the last 168 hours. ?Lipid Profile: ?No results for input(s): CHOL, HDL, LDLCALC, TRIG, CHOLHDL, LDLDIRECT in the last 72 hours. ?Thyroid Function Tests: ?No results for input(s): TSH, T4TOTAL, FREET4, T3FREE, THYROIDAB in the last 72 hours. ?Anemia Panel: ?No results for input(s): VITAMINB12, FOLATE, FERRITIN, TIBC, IRON, RETICCTPCT in the last 72 hours. ?Urine analysis: ?   ?Component Value Date/Time  ? COLORURINE YELLOW 11/28/2021 2300  ? APPEARANCEUR HAZY (A) 11/28/2021 2300  ? LABSPEC 1.045 (H) 11/28/2021 2300  ? PHURINE 5.0 11/28/2021 2300  ? GLUCOSEU 50 (A) 11/28/2021 2300  ? HGBUR SMALL (A) 11/28/2021 2300  ? BILIRUBINUR NEGATIVE 11/28/2021 2300  ? KETONESUR 20 (A) 11/28/2021 2300  ?  PROTEINUR 100 (A) 11/28/2021 2300  ? NITRITE NEGATIVE 11/28/2021 2300  ? LEUKOCYTESUR NEGATIVE 11/28/2021 2300  ? ?Sepsis Labs: ?@LABRCNTIP (procalcitonin:4,lacticidven:4) ?)No results found for this or any previous visit (from the past 240 hour(s)).  ? ?Radiological Exams on Admission: ?DG Chest 2 View ? ?Result Date: 11/28/2021 ?CLINICAL DATA:  Shortness of breath.  Chest pain. EXAM: CHEST - 2 VIEW COMPARISON:  Radiograph 12/05/2013 FINDINGS: Lung volumes are low. The heart is normal in size for technique. Subsegmental atelectasis at the left lung base. There is no pulmonary edema, pleural effusion, or pneumothorax. No acute osseous abnormalities are seen. IMPRESSION: Low lung volumes with left basilar atelectasis. Electronically Signed   By: 12/07/2013 M.Roberts.   On: 11/28/2021 20:17  ? ?  CT ABDOMEN PELVIS W CONTRAST ? ?Result Date: 11/28/2021 ?CLINICAL DATA:  RLQ abdominal pain (Age >= 14y) EXAM: CT ABDOMEN AND PELVIS WITH CONTRAST TECHNIQUE: Multidetector CT imaging of the abdomen and pelvis was performed using the standard protocol following bolus administration of intravenous contrast. RADIATION DOSE REDUCTION: This exam was performed according to the departmental dose-optimization program which includes automated exposure control, adjustment of the mA and/or kV according to patient size and/or use of iterative reconstruction technique. CONTRAST:  OMNIPAQUE IOHEXOL 300 MG/ML  SOLN COMPARISON:  None Available. FINDINGS: Lower chest: Airspace consolidation in the medial right middle lobe with air bronchograms most consistent with pneumonia. There are hypoventilatory changes within both lung bases. Trace right pleural thickening. Hepatobiliary: Diffusely decreased hepatic density consistent with steatosis. The liver appears elongated spanning 23 cm cranial caudal. No focal liver lesion. Gallbladder physiologically distended, no calcified stone. No biliary dilatation. Pancreas: No ductal dilatation or  inflammation. Spleen: Normal in size without focal abnormality. Adrenals/Urinary Tract: Normal adrenal glands. No hydronephrosis. Bilateral parapelvic cysts. Additional small cortical cyst in the posterior right kidney. No further characteri

## 2021-11-29 NOTE — ED Notes (Signed)
Pt currently sleeping, respirations equal & unlabored.  ?

## 2021-11-29 NOTE — Progress Notes (Signed)
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 ?BUN/creatinine 18/0.8, CK 32 ?WBC 33 ?Lactic acid 1.6 ? ?CT chest showed central necrosis ? ?S:- ?Patient is in severe pain she cannot move without having pain which radiates from the right mid back to the left mid back and up the shoulder ? ?O: BP (!) 110/97 (BP Location: Right Arm)   Pulse 93   Temp 98.5 ?F (36.9 ?C) (Oral)   Resp (!) 21   Ht 5\' 9"  (1.753 m)   Wt 83.9 kg   SpO2 98%   BMI 27.32 kg/m?  ?Patient in pain-no icterus no pallor-on movement and lowering the bed patient has severe paroxysm of right back pain ?Chest is clear no rales no rhonchi exam is limited by patient's ability to move ?Abdomen is soft ?No lower extremity edema ? ? ?P ?Patient has severe pleurisy and probably pain from the central necrosis on the CT chest-I will increase pain meds to Dilaudid for severe pain and placed on scheduled Percocet with some Naprosyn 500 twice daily ?Expect patient will need long-term antibiotics and may need input from pulmonology --I will ask them not emergently to visit with her ? ?Labs a.m. ? ?Verneita Griffes, MD ?[facility] ?8:45 AM ? ?

## 2021-11-29 NOTE — Progress Notes (Signed)
Pharmacy Antibiotic Note ? ?Robyn Roberts is a 66 y.o. female admitted on 11/28/2021 with pneumonia.  Pharmacy has been consulted for Levaquin dosing.  Pharmacist asked to investigate beta-lactam allergy. If history of intolerance, mild allergy, or documented history of use of cephalosporins, pharmacy can adjust levofloxacin to ceftriaxone and azithromycin. ? ?PCN allergy = swelling ?No documentation in epic of any cephalosporin use ? ?Plan: ?Levaquin 750mg  IV q24h ?Need for further dosage adjustment appears unlikely at present.   ? ?Will sign off at this time.  Please reconsult if a change in clinical status warrants re-evaluation of dosage. ? ? ?Height: 5\' 9"  (175.3 cm) ?Weight: 83.9 kg (185 lb) ?IBW/kg (Calculated) : 66.2 ? ?Temp (24hrs), Avg:98.3 ?F (36.8 ?C), Min:98.1 ?F (36.7 ?C), Max:98.5 ?F (36.9 ?C) ? ?Recent Labs  ?Lab 11/28/21 ?1932 11/28/21 ?2159 11/28/21 ?2357  ?WBC 33.8*  --   --   ?CREATININE 0.92  --   --   ?LATICACIDVEN  --  1.6 1.5  ?  ?Estimated Creatinine Clearance: 71.5 mL/min (by C-G formula based on SCr of 0.92 mg/dL).   ? ?Allergies  ?Allergen Reactions  ? Aspirin Swelling  ? Penicillins Swelling  ? Sulfa Antibiotics Hives  ? ? ? ?Thank you for allowing pharmacy to be a part of this patient?s care. ? ?2160, PharmD ?11/29/2021 3:20 AM ? ?

## 2021-11-30 DIAGNOSIS — J189 Pneumonia, unspecified organism: Secondary | ICD-10-CM | POA: Diagnosis not present

## 2021-11-30 LAB — BASIC METABOLIC PANEL
Anion gap: 8 (ref 5–15)
BUN: 16 mg/dL (ref 8–23)
CO2: 22 mmol/L (ref 22–32)
Calcium: 8.7 mg/dL — ABNORMAL LOW (ref 8.9–10.3)
Chloride: 106 mmol/L (ref 98–111)
Creatinine, Ser: 0.84 mg/dL (ref 0.44–1.00)
GFR, Estimated: >60 mL/min (ref >60)
Glucose, Bld: 139 mg/dL — ABNORMAL HIGH (ref 70–99)
Potassium: 4.3 mmol/L (ref 3.5–5.1)
Sodium: 136 mmol/L (ref 135–145)

## 2021-11-30 LAB — CBC WITH DIFFERENTIAL/PLATELET
Abs Immature Granulocytes: 0.26 10*3/uL — ABNORMAL HIGH (ref 0.00–0.07)
Basophils Absolute: 0.1 10*3/uL (ref 0.0–0.1)
Basophils Relative: 0 %
Eosinophils Absolute: 0.1 10*3/uL (ref 0.0–0.5)
Eosinophils Relative: 1 %
HCT: 39.8 % (ref 36.0–46.0)
Hemoglobin: 12.9 g/dL (ref 12.0–15.0)
Immature Granulocytes: 1 %
Lymphocytes Relative: 4 %
Lymphs Abs: 0.9 10*3/uL (ref 0.7–4.0)
MCH: 30.6 pg (ref 26.0–34.0)
MCHC: 32.4 g/dL (ref 30.0–36.0)
MCV: 94.3 fL (ref 80.0–100.0)
Monocytes Absolute: 1.1 10*3/uL — ABNORMAL HIGH (ref 0.1–1.0)
Monocytes Relative: 5 %
Neutro Abs: 18.2 10*3/uL — ABNORMAL HIGH (ref 1.7–7.7)
Neutrophils Relative %: 89 %
Platelets: 221 10*3/uL (ref 150–400)
RBC: 4.22 MIL/uL (ref 3.87–5.11)
RDW: 13.1 % (ref 11.5–15.5)
WBC: 20.7 10*3/uL — ABNORMAL HIGH (ref 4.0–10.5)
nRBC: 0 % (ref 0.0–0.2)

## 2021-11-30 MED ORDER — LACTATED RINGERS IV SOLN: INTRAVENOUS | Status: DC

## 2021-11-30 NOTE — Progress Notes (Signed)
PHARMACY - PHYSICIAN COMMUNICATION ?CRITICAL VALUE ALERT - BLOOD CULTURE IDENTIFICATION (BCID) ? ?Robyn Roberts is an 66 y.o. female who presented to [facility] on 11/28/2021 with necrotic pneumonia ? ?Assessment:  BCID + for haemophilus influenzae in 4/4 bottles ? ?Name of physician (or Provider) ContactedEmeline Darling, FNP ? ?Current antibiotics: Levaquin and Metronidazole ? ?Changes to prescribed antibiotics recommended:  ?Patient with PCN allergy (swelling) and no h/o cephalosporin use in Epic.  Continue current antibiotics at this time. ? ?Results for orders placed or performed during the hospital encounter of 11/28/21  ?Blood Culture ID Panel (Reflexed) (Collected: 11/28/2021  9:59 PM)  ?Result Value Ref Range  ? Enterococcus faecalis NOT DETECTED NOT DETECTED  ? Enterococcus Faecium NOT DETECTED NOT DETECTED  ? Listeria monocytogenes NOT DETECTED NOT DETECTED  ? Staphylococcus species NOT DETECTED NOT DETECTED  ? Staphylococcus aureus (BCID) NOT DETECTED NOT DETECTED  ? Staphylococcus epidermidis NOT DETECTED NOT DETECTED  ? Staphylococcus lugdunensis NOT DETECTED NOT DETECTED  ? Streptococcus species NOT DETECTED NOT DETECTED  ? Streptococcus agalactiae NOT DETECTED NOT DETECTED  ? Streptococcus pneumoniae NOT DETECTED NOT DETECTED  ? Streptococcus pyogenes NOT DETECTED NOT DETECTED  ? A.calcoaceticus-baumannii NOT DETECTED NOT DETECTED  ? Bacteroides fragilis NOT DETECTED NOT DETECTED  ? Enterobacterales NOT DETECTED NOT DETECTED  ? Enterobacter cloacae complex NOT DETECTED NOT DETECTED  ? Escherichia coli NOT DETECTED NOT DETECTED  ? Klebsiella aerogenes NOT DETECTED NOT DETECTED  ? Klebsiella oxytoca NOT DETECTED NOT DETECTED  ? Klebsiella pneumoniae NOT DETECTED NOT DETECTED  ? Proteus species NOT DETECTED NOT DETECTED  ? Salmonella species NOT DETECTED NOT DETECTED  ? Serratia marcescens NOT DETECTED NOT DETECTED  ? Haemophilus influenzae DETECTED (A) NOT DETECTED  ? Neisseria meningitidis NOT DETECTED NOT  DETECTED  ? Pseudomonas aeruginosa NOT DETECTED NOT DETECTED  ? Stenotrophomonas maltophilia NOT DETECTED NOT DETECTED  ? Candida albicans NOT DETECTED NOT DETECTED  ? Candida auris NOT DETECTED NOT DETECTED  ? Candida glabrata NOT DETECTED NOT DETECTED  ? Candida krusei NOT DETECTED NOT DETECTED  ? Candida parapsilosis NOT DETECTED NOT DETECTED  ? Candida tropicalis NOT DETECTED NOT DETECTED  ? Cryptococcus neoformans/gattii NOT DETECTED NOT DETECTED  ? ? ?Maryellen Pile, PharmD ?11/30/2021  12:10 AM ? ?

## 2021-11-30 NOTE — Progress Notes (Signed)
?PROGRESS NOTE ? ? ?Robyn Roberts  [id number] DOB: [birth year] DOA: 11/28/2021 ?PCP: Lorrene Reid, PA-C  ?Brief Narrative:  ? 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  ?BUN/creatinine 18/0.8, CK 32 ?WBC 33 ?Lactic acid 1.6 ?  ?CT chest showed central necrosis ? ?Hospital-Problem based course ? 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  NASH-outpatient follow-up and stratification ?Chronic low back pain ?Outpatient follow-up ?Impaired glucose tolerance ?Outpatient stratification with A1c ? ?DVT prophylaxis: Lovenox ?Code Status: Full ?Family Communication: Discussed with patient's mother brother and sister-in-law at the bedside-explained in detail ?Disposition:  ?Status is: Inpatient ?Remains inpatient appropriate because:  ? ?Still on IV antibiotics still pretty sick ?  ?Consultants:  ?None currently ? ?Procedures: No ? ?Antimicrobials: As above ? ? ?Subjective: ?Awake coherent no discomfort at this time no chest wall pain ? ? ?Objective: ?Vitals:  ? 11/29/21 1630 11/29/21 1703 11/29/21 2155 11/30/21 0500  ?BP: 103/67 (!) 116/57 113/66 112/62  ?Pulse: 66 72 70 92  ?Resp: 18 20    ?Temp:  (!) 97.5 ?F (36.4 ?C) 98.2 ?F  (36.8 ?C) 98 ?F (36.7 ?C)  ?TempSrc:  Oral Oral Oral  ?SpO2: 95% 94% 93% 95%  ?Weight:      ?Height:      ? ? ?Intake/Output Summary (Last 24 hours) at 11/30/2021 1142 ?Last data filed at 11/30/2021 0957 ?Gross per 24 hour  ?Intake 218 ml  ?Output --  ?Net 218 ml  ? ?Filed Weights  ? 11/28/21 1919  ?Weight: 83.9 kg  ? ? ?Examination: ? ? ? ?Data Reviewed: personally reviewed  ? ?CBC ?   ?Component Value Date/Time  ? WBC 20.7 (H) 11/30/2021 0818  ? RBC 4.22 11/30/2021 0818  ? HGB 12.9 11/30/2021 0818  ? HGB 14.1 11/29/2020 0842  ? HCT 39.8 11/30/2021 0818  ? HCT 41.4 11/29/2020 0842  ? PLT 221 11/30/2021 0818  ? PLT 248 11/29/2020 0842  ? MCV 94.3 11/30/2021 0818  ? MCV 89 11/29/2020 0842  ? MCV 93 12/05/2013 1622  ? MCH 30.6 11/30/2021 0818  ? MCHC 32.4 11/30/2021 0818  ? RDW 13.1 11/30/2021 0818  ? RDW 11.9 11/29/2020 0842  ? RDW 12.3 12/05/2013 1622  ? LYMPHSABS 0.9 11/30/2021 0818  ? LYMPHSABS 2.0 05/12/2019 0820  ? LYMPHSABS 1.6 12/05/2013 1622  ? MONOABS 1.1 (H) 11/30/2021 0818  ? MONOABS 0.6 12/05/2013 1622  ? EOSABS 0.1 11/30/2021 0818  ? EOSABS 0.4 05/12/2019 0820  ? EOSABS 0.1 12/05/2013 1622  ? BASOSABS 0.1 11/30/2021 0818  ? BASOSABS 0.1 05/12/2019 0820  ? BASOSABS 0.0 12/05/2013 1622  ? ? ?  Latest Ref Rng & Units 11/30/2021  ?  5:49 AM 11/29/2021  ?  3:15 AM 11/28/2021  ?  7:32 PM  ?CMP  ?Glucose 70 - 99 mg/dL 139   149   128    ?BUN 8 - 23 mg/dL 16   18   18     ?Creatinine 0.44 - 1.00 mg/dL 0.84   0.80   0.92    ?Sodium 135 - 145 mmol/L 136   136   137    ?Potassium 3.5 - 5.1 mmol/L 4.3   3.6   3.6    ?Chloride 98 - 111 mmol/L 106   105   108    ?CO2 22 - 32 mmol/L 22   23   19     ?Calcium 8.9 - 10.3 mg/dL 8.7   9.1   9.3    ?Total Protein 6.5 - 8.1 g/dL  7.0   6.8    ?Total Bilirubin 0.3 - 1.2 mg/dL  1.2   1.3    ?Alkaline Phos 38 - 126 U/L  70   70    ?AST 15 - 41 U/L  15   17    ?ALT 0 - 44 U/L  20   22    ? ? ? ?Radiology Studies: ?DG Chest 2 View ? ?Result Date: 11/28/2021 ?CLINICAL DATA:  Shortness of  breath.  Chest pain. EXAM: CHEST - 2 VIEW COMPARISON:  Radiograph 12/05/2013 FINDINGS: Lung volumes are low. The heart is normal in size for technique. Subsegmental atelectasis at the left lung base. There is no pulmonary edema, pleural effusion, or pneumothorax. No acute osseous abnormalities are seen. IMPRESSION: Low lung volumes with left basilar atelectasis. Electronically Signed   By: Keith Rake M.D.   On: 11/28/2021 20:17  ? ?CT CHEST W CONTRAST ? ?Result Date: 11/29/2021 ?CLINICAL DATA:  The chest wall pain, nontraumatic. Infection or inflammation suspected EXAM: CT CHEST WITH CONTRAST TECHNIQUE: Multidetector CT imaging of the chest was performed during intravenous contrast administration. RADIATION DOSE REDUCTION: This exam was performed according to the departmental dose-optimization program which includes automated exposure control, adjustment of the mA and/or kV according to patient size and/or use of iterative reconstruction technique. CONTRAST:  34mL OMNIPAQUE IOHEXOL 300 MG/ML  SOLN COMPARISON:  Chest radiograph from yesterday. Abdominal CT from yesterday FINDINGS: Cardiovascular: Normal heart size. No pericardial effusion. No acute vascular finding. Mediastinum/Nodes: Negative for adenopathy or mass. Lungs/Pleura: Focal airspace disease in the right middle lobe best seen yesterday. Centrally there is some loss of internal architecture and low-density suggesting necrosis. No discrete cavitation. Regional pulmonary vessels are enhancing as permitted by contrast timing. Atelectasis at the lung bases with bands of opacity and volume loss that is progressed. Trace pleural fluid on the right. Upper Abdomen: No change from yesterday Musculoskeletal: No acute finding IMPRESSION: 1. Right middle lobe infiltrate without progression from yesterday's abdominal CT. There is suggestion of central necrosis, recommend follow-up. 2. Worsening atelectasis at the lung bases. Electronically Signed   By: Jorje Guild M.D.   On: 11/29/2021 06:53  ? ?CT ABDOMEN PELVIS W CONTRAST ? ?Result Date: 11/28/2021 ?CLINICAL DATA:  RLQ abdominal pain (Age >= 14y) EXAM: CT ABDOMEN AND PELVIS WITH CONTRAST TECHNIQUE: Multidetector CT imaging of the abdomen and pelvis was performed using the standard protocol following bolus administration of intravenous contrast. RADIATION DOSE REDUCTION: This exam was performed according to the departmental dose-optimization program which includes automated exposure control, adjustment of the mA and/or kV according to patient size and/or use of iterative reconstruction technique. CONTRAST:  162mL OMNIPAQUE IOHEXOL 300 MG/ML  SOLN COMPARISON:  None Available. FINDINGS: Lower  chest: Airspace consolidation in the medial right middle lobe with air bronchograms most consistent with pneumonia. There are hypoventilatory changes within both lung bases. Trace right pleural thickening. Hepatobiliary: Diffusely decreased hepatic density consistent with steatosis. The liver appears elongated spanning 23 cm cranial caudal. No focal liver lesion. Gallbladder physiologically distended, no calcified stone. No biliary dilatation. Pancreas: No ductal dilatation or inflammation. Spleen: Normal in size without focal abnormality. Adrenals/Urinary Tract: Normal adrenal glands. No hydronephrosis. Bilateral parapelvic cysts. Additional small cortical cyst in the posterior right kidney. No further characterization or follow-up is needed. No renal calculi. Partially distended urinary bladder, small cystocele. No bladder wall thickening. Stomach/Bowel: Unremarkable appearance of the stomach. No small bowel obstruction or inflammation. Normal appendix. Mild sigmoid colonic diverticulosis without diverticulitis or acute colonic inflammation. Vascular/Lymphatic: Normal caliber abdominal aorta, minimal atherosclerosis. Patent portal, splenic, and mesenteric veins. No abdominopelvic adenopathy. Reproductive: Uterus and bilateral  adnexa are unremarkable. Other: No free air or ascites. Small bilateral fat containing inguinal hernias. Tiny fat containing umbilical hernia. Musculoskeletal: There are no acute or suspicious osseous abnor

## 2021-12-01 ENCOUNTER — Encounter: Payer: Self-pay | Admitting: Family Medicine

## 2021-12-01 DIAGNOSIS — J189 Pneumonia, unspecified organism: Secondary | ICD-10-CM | POA: Diagnosis not present

## 2021-12-01 LAB — CBC WITH DIFFERENTIAL/PLATELET
Abs Immature Granulocytes: 0.11 10*3/uL — ABNORMAL HIGH (ref 0.00–0.07)
Basophils Absolute: 0.1 10*3/uL (ref 0.0–0.1)
Basophils Relative: 0 %
Eosinophils Absolute: 0.2 10*3/uL (ref 0.0–0.5)
Eosinophils Relative: 1 %
HCT: 38.6 % (ref 36.0–46.0)
Hemoglobin: 12.6 g/dL (ref 12.0–15.0)
Immature Granulocytes: 1 %
Lymphocytes Relative: 6 %
Lymphs Abs: 0.8 10*3/uL (ref 0.7–4.0)
MCH: 30.2 pg (ref 26.0–34.0)
MCHC: 32.6 g/dL (ref 30.0–36.0)
MCV: 92.6 fL (ref 80.0–100.0)
Monocytes Absolute: 1.4 10*3/uL — ABNORMAL HIGH (ref 0.1–1.0)
Monocytes Relative: 9 %
Neutro Abs: 12.4 10*3/uL — ABNORMAL HIGH (ref 1.7–7.7)
Neutrophils Relative %: 83 %
Platelets: 230 10*3/uL (ref 150–400)
RBC: 4.17 MIL/uL (ref 3.87–5.11)
RDW: 12.9 % (ref 11.5–15.5)
WBC: 15 10*3/uL — ABNORMAL HIGH (ref 4.0–10.5)
nRBC: 0 % (ref 0.0–0.2)

## 2021-12-01 LAB — BASIC METABOLIC PANEL
Anion gap: 9 (ref 5–15)
BUN: 17 mg/dL (ref 8–23)
CO2: 24 mmol/L (ref 22–32)
Calcium: 9 mg/dL (ref 8.9–10.3)
Chloride: 111 mmol/L (ref 98–111)
Creatinine, Ser: 0.81 mg/dL (ref 0.44–1.00)
GFR, Estimated: >60 mL/min (ref >60)
Glucose, Bld: 107 mg/dL — ABNORMAL HIGH (ref 70–99)
Potassium: 3.6 mmol/L (ref 3.5–5.1)
Sodium: 144 mmol/L (ref 135–145)

## 2021-12-01 MED ORDER — AMOXICILLIN-POT CLAVULANATE 875-125 MG PO TABS: 1.0000 | ORAL_TABLET | Freq: Two times a day (BID) | ORAL | 0 refills | Status: DC

## 2021-12-01 NOTE — Progress Notes (Unsigned)
{  Select_[email or web address]:[id number]} 

## 2021-12-01 NOTE — Discharge Summary (Signed)
Physician Discharge Summary  ?Robyn Roberts XBD:[id number] DOB: 1955-09-24 DOA: 11/28/2021 ? ?PCP: Mayer Masker, PA-C ? ?Admit date: 11/28/2021 ?Discharge date: 12/01/2021 ? ?Time spent: 30 minutes ? ?Recommendations for Outpatient Follow-up:  ?Needs CXR in 1 month or so ?Complete at least 3 weeks of antibiotics-reevaluate as an outpatient with PCP in 2 weeks signs symptoms + Chem-12 and CBC ?Work note given to patient post discharge ?Suggest outpatient lifestyle modification in terms of diet exercise as does have some mild steatotic changes to liver and does have, Slightly elevated A1c ?Discharge Diagnoses:  ?MAIN problem for hospitalization  ? ?Necrotic pneumonia ? ?Please see below for itemized issues addressed in HOpsital- ?refer to other progress notes for clarity if needed ? ?Discharge Condition: Much improved ? ?Diet recommendation: Heart healthy ? ?Filed Weights  ? 11/28/21 1919  ?Weight: 83.9 kg  ? ? 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  ?BUN/creatinine 18/0.8, CK 32 ?WBC 33 ?Lactic acid 1.6 ?  ?CT chest showed central necrosis ?  ? 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  NASH-outpatient follow-up and stratification ?Chronic low back pain ?Outpatient follow-up ?Impaired glucose  tolerance ?Outpatient stratification with A1c ? ? ?Discharge Exam: ?Vitals:  ? 12/01/21 0655 12/01/21 1316  ?BP: 134/86 (!) 143/81  ?Pulse: 82 75  ?Resp: 18 18  ?Temp: 98.1 ?F (36.7 ?C) 98.6 ?F (37 ?C)  ?SpO2: 96% 96%  ? ? ?Subj on day of d/c ?  ?Looks much better walking around the hallway with daughter no pain no fever no chills no nausea some cough ?Overall feels close to normal-asking if she can go home ? ?General Exam on discharge ? ?EOMI NCAT no focal deficit CTA B no rales rhonchi no wheeze ?Abdomen soft no rebound no guarding S1-S2 no murmur ?No lower extremity edema ?Neuro intact ? ?Discharge Instructions ? ? ?Discharge Instructions   ? ? Diet - low sodium heart healthy   Complete by: As directed ?  ? Discharge instructions   Complete by: As directed ?  ? Finish all the antibiotics completely ?Get an xray in 5 weeks and follow with your PA ? ?Go to emergency room if severe pain, fever or feel very ill ?Best of luck  ? Increase activity slowly   Complete by: As directed ?  ? ?  ? ?Allergies as of 12/01/2021   ? ?   Reactions  ? Aspirin Swelling  ? Sulfa Antibiotics Hives  ? ?  ? ?  ?Medication List  ?  ? ?STOP taking these medications   ? ?meloxicam 15 MG tablet ?Commonly known as: MOBIC ?  ? ?  ? ?TAKE these medications   ? ?amoxicillin-clavulanate 875-125 MG tablet ?Commonly known as: Augmentin ?Take 1 tablet by mouth 2 (two) times daily for 17 days. ?  ?famotidine 20 MG tablet ?Commonly  known as: PEPCID ?TAKE 1 TABLET AT BEDTIME ?  ?ibuprofen 200 MG tablet ?Commonly known as: ADVIL ?Take 600 mg by mouth every 6 (six) hours as needed for moderate pain. ?  ?loratadine 10 MG tablet ?Commonly known as: CLARITIN ?Take 10 mg by mouth daily. ?  ?Melatonin 10 MG Tabs ?Take 1 tablet by mouth as needed. For sleep and get the sustained release tablets to help you stay asleep ?What changed:  ?how much to take ?when to take this ?  ?MULTIPLE VITAMIN PO ?Take 1 tablet by mouth daily. ?  ?olopatadine 0.1 % ophthalmic  solution ?Commonly known as: Patanol ?Place 1 drop into both eyes 2 (two) times daily. ?What changed:  ?when to take this ?reasons to take this ?  ?polyethylene glycol powder 17 GM/SCOOP powder ?Commonly known as: GLYCOLAX/MIRALAX ?Take 17 g by mouth 2 (two) times daily as needed. ?What changed: when to take this ?  ?traZODone 100 MG tablet ?Commonly known as: DESYREL ?TAKE ONE AND ONE-HALF TABLETS (150 MG) AT BEDTIME AS NEEDED FOR SLEEP. ?What changed:  ?how much to take ?how to take this ?when to take this ?  ? ?  ? ?Allergies  ?Allergen Reactions  ? Aspirin Swelling  ? Sulfa Antibiotics Hives  ? ? ? ? ?The results of significant diagnostics from this hospitalization (including imaging, microbiology, ancillary and laboratory) are listed below for reference.   ? ?Significant Diagnostic Studies: ?DG Chest 2 View ? ?Result Date: 11/28/2021 ?CLINICAL DATA:  Shortness of breath.  Chest pain. EXAM: CHEST - 2 VIEW COMPARISON:  Radiograph 12/05/2013 FINDINGS: Lung volumes are low. The heart is normal in size for technique. Subsegmental atelectasis at the left lung base. There is no pulmonary edema, pleural effusion, or pneumothorax. No acute osseous abnormalities are seen. IMPRESSION: Low lung volumes with left basilar atelectasis. Electronically Signed   By: Narda RutherfordMelanie  Sanford M.D.   On: 11/28/2021 20:17  ? ?CT CHEST W CONTRAST ? ?Result Date: 11/29/2021 ?CLINICAL DATA:  The chest wall pain, nontraumatic. Infection or inflammation suspected EXAM: CT CHEST WITH CONTRAST TECHNIQUE: Multidetector CT imaging of the chest was performed during intravenous contrast administration. RADIATION DOSE REDUCTION: This exam was performed according to the departmental dose-optimization program which includes automated exposure control, adjustment of the mA and/or kV according to patient size and/or use of iterative reconstruction technique. CONTRAST:  75mL OMNIPAQUE IOHEXOL 300 MG/ML  SOLN COMPARISON:  Chest radiograph from yesterday.  Abdominal CT from yesterday FINDINGS: Cardiovascular: Normal heart size. No pericardial effusion. No acute vascular finding. Mediastinum/Nodes: Negative for adenopathy or mass. Lungs/Pleura: Focal airspace disease in the right middle lobe best seen yesterday. Centrally there is some loss of internal architecture and low-density suggesting necrosis. No discrete cavitation. Regional pulmonary vessels are enhancing as permitted by contrast timing. Atelectasis at the lung bases with bands of opacity and volume loss that is progressed. Trace pleural fluid on the right. Upper Abdomen: No change from yesterday Musculoskeletal: No acute finding IMPRESSION: 1. Right middle lobe infiltrate without progression from yesterday's abdominal CT. There is suggestion of central necrosis, recommend follow-up. 2. Worsening atelectasis at the lung bases. Electronically Signed   By: Tiburcio PeaJonathan  Watts M.D.   On: 11/29/2021 06:53  ? ?CT ABDOMEN PELVIS W CONTRAST ? ?Result Date: 11/28/2021 ?CLINICAL DATA:  RLQ abdominal pain (Age >= 14y) EXAM: CT ABDOMEN AND PELVIS WITH CONTRAST TECHNIQUE: Multidetector CT imaging of the abdomen and pelvis was performed using the standard protocol following bolus administration of intravenous contrast. RADIATION DOSE REDUCTION:  This exam was performed according to the departmental dose-optimization program which includes automated exposure control, adjustment of the mA and/or kV according to patient size and/or use of iterative reconstruction technique. CONTRAST:  OMNIPAQUE IOHEXOL 300 MG/ML  SOLN COMPARISON:  None Available. FINDINGS: Lower chest: Airspace consolidation in the medial right middle lobe with air bronchograms most consistent with pneumonia. There are hypoventilatory changes within both lung bases. Trace right pleural thickening. Hepatobiliary: Diffusely decreased hepatic density consistent with steatosis. The liver appears elongated spanning 23 cm cranial caudal. No focal liver lesion.  Gallbladder physiologically distended, no calcified stone. No biliary dilatation. Pancreas: No ductal dilatation or inflammation. Spleen: Normal in size without focal abnormality. Adrenals/Urinary Tract: Normal a

## 2021-12-01 NOTE — TOC CM/SW Note (Signed)
?  Transition of Care (TOC) Screening Note ? ? ?Patient Details  ?Name: Robyn Roberts ?Date of Birth: March 04, 1956 ? ? ?Transition of Care (TOC) CM/SW Contact:    ?Darleene Cleaver, LCSW ?Phone Number: ?12/01/2021, 1:00 PM ? ? ? ?Transition of Care Department [facility]) has reviewed patient and no TOC needs have been identified at this time. We will continue to monitor patient advancement through interdisciplinary progression rounds. If new patient transition needs arise, please place a TOC consult. ?  ?

## 2021-12-01 NOTE — Progress Notes (Signed)
Pt will leave with her spouse this afternoon. ?Alert and oriented; without c/o. ?Discharge instructions given/explained with pt verbalizing understanding.  ?Pt aware to followup with PCP.  ?Pt left with "note for work from MD". ?

## 2021-12-02 LAB — LEGIONELLA PNEUMOPHILA SEROGP 1 UR AG: L. pneumophila Serogp 1 Ur Ag: NEGATIVE

## 2021-12-03 ENCOUNTER — Telehealth: Payer: Self-pay | Admitting: Physician Assistant

## 2021-12-03 NOTE — Telephone Encounter (Signed)
I have already discussed this with the patient and the patient wishes to stay on the Amoxicillin. AS, CMA ?

## 2021-12-03 NOTE — Telephone Encounter (Signed)
Patients daughter called back and said the Zpak wouldn't be strong enough is there a stronger antibiotic to be given? ?

## 2021-12-03 NOTE — Telephone Encounter (Signed)
Patient was just released from the hospital and has a follow up appointment on Monday May 22 here in office however they had discharged her with Amoxicillin and she is extremely dizzy and cannot hardly stand up. Daughter is asking do they need to stop taking the medication. Please advise. [phone] [place]ras ?

## 2021-12-03 NOTE — Telephone Encounter (Signed)
Patient having dizziness with any/all movement and diarrhea 4 times today. She states she has already taken the Amoxicillin today and is not interested in stopping this antibiotic. She states she "HAS" to finish this antibiotic to get rid of the pneumonia. She was offered a zpack and states it will not be strong enough to treat her.  ? ?Patient advised to increase her water intake and drink Pedialyte and to slowing change positions. Patient verbalized understanding. AS, CMA ?

## 2021-12-05 ENCOUNTER — Encounter: Payer: Self-pay | Admitting: Physician Assistant

## 2021-12-05 ENCOUNTER — Ambulatory Visit (INDEPENDENT_AMBULATORY_CARE_PROVIDER_SITE_OTHER): Payer: BC Managed Care – PPO | Admitting: Physician Assistant

## 2021-12-05 VITALS — BP 118/72 | HR 99 | Temp 97.7°F | Ht 69.0 in | Wt 181.0 lb

## 2021-12-05 DIAGNOSIS — R42 Dizziness and giddiness: Secondary | ICD-10-CM | POA: Diagnosis not present

## 2021-12-05 DIAGNOSIS — Z09 Encounter for follow-up examination after completed treatment for conditions other than malignant neoplasm: Secondary | ICD-10-CM | POA: Diagnosis not present

## 2021-12-05 DIAGNOSIS — J189 Pneumonia, unspecified organism: Secondary | ICD-10-CM

## 2021-12-05 LAB — CULTURE, BLOOD (ROUTINE X 2): Special Requests: ADEQUATE

## 2021-12-05 NOTE — Progress Notes (Signed)
Established patient visit   Patient: Robyn Roberts   DOB: 1956/01/07   66 y.o. Female  MRN: [id number] Visit Date: 12/05/2021  Chief Complaint  Patient presents with   Hospitalization Follow-up   Subjective    HPI  Patient presents for hospital follow-up. Patient reports has been feeling some better since discharge. Cough has been mostly dry and able to take a deep breath without having sharp pain. States diarrhea has improved. Dizziness occurs with position changes and it has been some better. States drinking at least 64 fl oz daily. Appetite has improved and eating smaller meals vs large meals.  Discharge summary: Admit date: 11/28/2021 Discharge date: 12/01/2021   Time spent: 30 minutes   Recommendations for Outpatient Follow-up:  Needs CXR in 1 month or so Complete at least 3 weeks of antibiotics-reevaluate as an outpatient with PCP in 2 weeks signs symptoms + Chem-12 and CBC Work note given to patient post discharge Suggest outpatient lifestyle modification in terms of diet exercise as does have some mild steatotic changes to liver and does have, Slightly elevated A1c Discharge Diagnoses:  MAIN problem for hospitalization    Necrotic pneumonia   Please see below for itemized issues addressed in [place]- refer to other progress notes for clarity if needed   Discharge Condition: Much improved   Diet recommendation: Heart healthy      Filed Weights    11/28/21 1919  Weight: 83.9 kg      History of present illness:  66 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) known history of chronic LBP, right-sided chest pain X24 H-came to ED with pleuritic chest pain WBC 33,000, CT ABD = RML PNA Empiric antibiotics started   BUN/creatinine 18/0.8, CK 32 WBC 33 Lactic acid 1.6   CT chest showed central necrosis     Hospital Course:  Necrotizing pneumonia--Klebsiella positive on 4 4 blood cultures No clear explanation  as to why she has this patient is not immunosuppressed but does work as a Statistician so may have been exposed to someone with a bad pneumonia Was placed on Levaquin and Flagyl for broad coverage and narrowed on discharge to Augmentin to complete on around 5/31 (penicillin allergy removed only mild swelling not anaphylaxis Pain was much improved on discharge Hepatic steatosis and hepatomegaly on CT scan ?  NASH-outpatient follow-up and stratification Chronic low back pain Outpatient follow-up Impaired glucose tolerance Outpatient stratification with A1c     Discharge Exam:     Vitals:    12/01/21 0655 12/01/21 1316  BP: 134/86 (!) 143/81  Pulse: 82 75  Resp: 18 18  Temp: 98.1 F (36.7 C) 98.6 F (37 C)  SpO2: 96% 96%      Subj on day of d/c   Looks much better walking around the hallway with daughter no pain no fever no chills no nausea some cough Overall feels close to normal-asking if she can go home   General Exam on discharge   EOMI NCAT no focal deficit CTA B no rales rhonchi no wheeze Abdomen soft no rebound no guarding S1-S2 no murmur No lower extremity edema Neuro intact      Medications: Outpatient Medications Prior to Visit  Medication Sig   amoxicillin-clavulanate (AUGMENTIN) 875-125 MG tablet Take 1 tablet by mouth 2 (two) times daily for 17 days.   famotidine (PEPCID) 20 MG tablet TAKE 1 TABLET AT BEDTIME (Patient taking differently: Take 20 mg by mouth at bedtime.)  ibuprofen (ADVIL) 200 MG tablet Take 600 mg by mouth every 6 (six) hours as needed for moderate pain.   loratadine (CLARITIN) 10 MG tablet Take 10 mg by mouth daily.   Melatonin 10 MG TABS Take 1 tablet by mouth as needed. For sleep and get the sustained release tablets to help you stay asleep (Patient taking differently: Take 10 mg by mouth at bedtime. For sleep and get the sustained release tablets to help you stay asleep)   MULTIPLE VITAMIN PO Take 1 tablet by mouth daily.    olopatadine (PATANOL) 0.1 % ophthalmic solution Place 1 drop into both eyes 2 (two) times daily. (Patient taking differently: Place 1 drop into both eyes daily as needed for allergies.)   polyethylene glycol powder (GLYCOLAX/MIRALAX) 17 GM/SCOOP powder Take 17 g by mouth 2 (two) times daily as needed. (Patient taking differently: Take 17 g by mouth daily.)   traZODone (DESYREL) 100 MG tablet TAKE ONE AND ONE-HALF TABLETS (150 MG) AT BEDTIME AS NEEDED FOR SLEEP. (Patient taking differently: Take 150 mg by mouth at bedtime. TAKE ONE AND ONE-HALF TABLETS (150 MG) AT BEDTIME AS NEEDED FOR SLEEP.)   No facility-administered medications prior to visit.    Review of Systems Review of Systems:  A fourteen system review of systems was performed and found to be positive as per HPI.  Last CBC Lab Results  Component Value Date   WBC 15.0 (H) 12/01/2021   HGB 12.6 12/01/2021   HCT 38.6 12/01/2021   MCV 92.6 12/01/2021   MCH 30.2 12/01/2021   RDW 12.9 12/01/2021   PLT 230 95/03/3266   Last metabolic panel Lab Results  Component Value Date   GLUCOSE 107 (H) 12/01/2021   NA 144 12/01/2021   K 3.6 12/01/2021   CL 111 12/01/2021   CO2 24 12/01/2021   BUN 17 12/01/2021   CREATININE 0.81 12/01/2021   GFRNONAA >60 12/01/2021   CALCIUM 9.0 12/01/2021   PROT 7.0 11/29/2021   ALBUMIN 3.5 11/29/2021   LABGLOB 1.6 11/29/2020   AGRATIO 2.9 (H) 11/29/2020   BILITOT 1.2 11/29/2021   ALKPHOS 70 11/29/2021   AST 15 11/29/2021   ALT 20 11/29/2021   ANIONGAP 9 12/01/2021   Last lipids Lab Results  Component Value Date   CHOL 125 11/29/2020   HDL 52 11/29/2020   LDLCALC 55 11/29/2020   TRIG 95 11/29/2020   CHOLHDL 2.4 11/29/2020   Last hemoglobin A1c Lab Results  Component Value Date   HGBA1C 6.0 (H) 11/29/2020   Last thyroid functions Lab Results  Component Value Date   TSH 2.720 11/29/2020   T3TOTAL 132 05/12/2019   Last vitamin D Lab Results  Component Value Date   VD25OH 41.2  05/12/2019     Objective    BP 118/72   Pulse 99   Temp 97.7 F (36.5 C)   Ht '5\' 9"'  (1.753 m)   Wt 181 lb (82.1 kg)   SpO2 96%   BMI 26.73 kg/m  BP Readings from Last 3 Encounters:  12/05/21 118/72  12/01/21 (!) 143/81  11/28/21 103/65   Wt Readings from Last 3 Encounters:  12/05/21 181 lb (82.1 kg)  11/28/21 185 lb (83.9 kg)  11/28/21 185 lb (83.9 kg)    Physical Exam  General:  Cooperative, answers questions appropriately, in no acute distress  Neuro:  Alert and oriented,  extra-ocular muscles intact, nystagmus noted with peripheral gaze (left)  HEENT:  Normocephalic, atraumatic, neck supple  Skin:  no gross  rash, warm, pink. Cardiac:  RRR, S1 S2 Respiratory: CTA B/L w/o crackles or rales. There is decreased air movement of right lobe when compared to the left.  Vascular:  Ext warm, no cyanosis apprec.; cap RF less 2 sec. Psych:  No HI/SI, judgement and insight good, Euthymic mood. Full Affect.   No results found for any visits on 12/05/21.  Assessment & Plan      Problem List Items Addressed This Visit       Respiratory   CAP (community acquired pneumonia)   Relevant Orders   DG Chest 2 View   CBC w/Diff   Comp Met (CMET)   Other Visit Diagnoses     Hospital discharge follow-up    -  Primary   Dizziness           Hospital discharge follow-up, CAP: -Reviewed notes, labs and imaging. -Blood culture positive for haemophilus influenzae. Health Dept. has been notified. -Discussed with patient if Augmentin tolerable then recommend to continue antibiotic therapy. Discussed taking a probiotic. Recommend to return to work once she completes antibiotic therapy, provided work note. -Discussed to continue with adequate hydration and advance diet as tolerated.  -Discussed with patient dizziness is uncommon side effect with Augmentin. Dizziness likely multifactorial including current illness, diarrhea and also suspect vertigo (nystagmus noted with peripheral gaze).  Will continue to monitor. -Will repeat CXR in 4 weeks. Recommend to follow-up in 4 weeks.    Return in about 4 weeks (around 01/02/2022) for CAP- pt will call to make appt after she gets CXR.        Lorrene Reid, PA-C  [facility] at [facility] [phone] (phone) [phone] (fax)  [place]

## 2021-12-05 NOTE — Patient Instructions (Signed)
Vertigo Vertigo is the feeling that you or the things around you are moving when they are not. This feeling can come and go at any time. Vertigo often goes away on its own. This condition can be dangerous if it happens when you are doing activities like driving or working with machines. Your doctor will do tests to find the cause of your vertigo. These tests will also help your doctor decide on the best treatment for you. Follow these instructions at home: Eating and drinking     Drink enough fluid to keep your pee (urine) pale yellow. Do not drink alcohol. Activity Return to your normal activities when your doctor says that it is safe. In the morning, first sit up on the side of the bed. When you feel okay, stand slowly while you hold onto something until you know that your balance is fine. Move slowly. Avoid sudden body or head movements or certain positions, as told by your doctor. Use a cane if you have trouble standing or walking. Sit down right away if you feel dizzy. Avoid doing any tasks or activities that can cause danger to you or others if you get dizzy. Avoid bending down if you feel dizzy. Place items in your home so that they are easy for you to reach without bending or leaning over. Do not drive or use machinery if you feel dizzy. General instructions Take over-the-counter and prescription medicines only as told by your doctor. Keep all follow-up visits. Contact a doctor if: Your medicine does not help your vertigo. Your problems get worse or you have new symptoms. You have a fever. You feel like you may vomit (nauseous), or this feeling gets worse. You start to vomit. Your family or friends see changes in how you act. You lose feeling (have numbness) in part of your body. You feel prickling and tingling in a part of your body. Get help right away if: You are always dizzy. You faint. You get very bad headaches. You get a stiff neck. Bright light starts to bother  you. You have trouble moving or talking. You feel weak in your hands, arms, or legs. You have changes in your hearing or in how you see (vision). These symptoms may be an emergency. Get help right away. Call your local emergency services (911 in the [place] Do not wait to see if the symptoms will go away. Do not drive yourself to the hospital. Summary Vertigo is the feeling that you or the things around you are moving when they are not. Your doctor will do tests to find the cause of your vertigo. You may be told to avoid some tasks, positions, or movements. Contact a doctor if your medicine is not helping, or if you have a fever, new symptoms, or a change in how you act. Get help right away if you get very bad headaches, or if you have changes in how you speak, hear, or see. This information is not intended to replace advice given to you by your health care provider. Make sure you discuss any questions you have with your health care provider. Document Revised: 06/06/2020 Document Reviewed: 06/06/2020 Elsevier Patient Education  2023 [employer] Inc[employer]  

## 2021-12-06 LAB — CBC WITH DIFFERENTIAL/PLATELET
Basophils Absolute: 0.1 10*3/uL (ref 0.0–0.2)
Basos: 1 %
EOS (ABSOLUTE): 0.3 10*3/uL (ref 0.0–0.4)
Eos: 2 %
Hematocrit: 41.5 % (ref 34.0–46.6)
Hemoglobin: 14.5 g/dL (ref 11.1–15.9)
Immature Grans (Abs): 0.4 10*3/uL — ABNORMAL HIGH (ref 0.0–0.1)
Immature Granulocytes: 3 %
Lymphocytes Absolute: 1.4 10*3/uL (ref 0.7–3.1)
Lymphs: 9 %
MCH: 30.7 pg (ref 26.6–33.0)
MCHC: 34.9 g/dL (ref 31.5–35.7)
MCV: 88 fL (ref 79–97)
Monocytes Absolute: 1 10*3/uL — ABNORMAL HIGH (ref 0.1–0.9)
Monocytes: 7 %
Neutrophils Absolute: 12.1 10*3/uL — ABNORMAL HIGH (ref 1.4–7.0)
Neutrophils: 78 %
Platelets: 418 10*3/uL (ref 150–450)
RBC: 4.72 x10E6/uL (ref 3.77–5.28)
RDW: 12.1 % (ref 11.7–15.4)
WBC: 15.3 10*3/uL — ABNORMAL HIGH (ref 3.4–10.8)

## 2021-12-06 LAB — COMPREHENSIVE METABOLIC PANEL
ALT: 22 IU/L (ref 0–32)
AST: 18 IU/L (ref 0–40)
Albumin/Globulin Ratio: 1.5 (ref 1.2–2.2)
Albumin: 3.7 g/dL — ABNORMAL LOW (ref 3.8–4.8)
Alkaline Phosphatase: 94 IU/L (ref 44–121)
BUN/Creatinine Ratio: 17 (ref 12–28)
BUN: 13 mg/dL (ref 8–27)
Bilirubin Total: 0.6 mg/dL (ref 0.0–1.2)
CO2: 24 mmol/L (ref 20–29)
Calcium: 9.2 mg/dL (ref 8.7–10.3)
Chloride: 100 mmol/L (ref 96–106)
Creatinine, Ser: 0.76 mg/dL (ref 0.57–1.00)
Globulin, Total: 2.5 g/dL (ref 1.5–4.5)
Glucose: 94 mg/dL (ref 70–99)
Potassium: 4.1 mmol/L (ref 3.5–5.2)
Sodium: 141 mmol/L (ref 134–144)
Total Protein: 6.2 g/dL (ref 6.0–8.5)
eGFR: 87 mL/min/{1.73_m2} (ref >59)

## 2021-12-09 ENCOUNTER — Inpatient Hospital Stay: Payer: BC Managed Care – PPO | Admitting: Physician Assistant

## 2021-12-09 ENCOUNTER — Telehealth: Payer: Self-pay | Admitting: Physician Assistant

## 2021-12-09 DIAGNOSIS — J189 Pneumonia, unspecified organism: Secondary | ICD-10-CM

## 2021-12-09 MED ORDER — AMOXICILLIN-POT CLAVULANATE 875-125 MG PO TABS: 1.0000 | ORAL_TABLET | Freq: Two times a day (BID) | ORAL | 0 refills | Status: AC

## 2021-12-09 NOTE — Telephone Encounter (Signed)
Patient states the antibiotic given to her at hospital was only for 14 days and physician at hospital told her to take it for 17 days. She is asking what she needs to do?

## 2021-12-09 NOTE — Telephone Encounter (Signed)
Patient is aware 

## 2021-12-09 NOTE — Telephone Encounter (Signed)
Please advise patient per hospital discharge she was to take 3 weeks of the antibiotic. Per Anne Ng, I am sending 7 more days of the Augmentin to her local pharmacy. AS, CMA

## 2021-12-18 LAB — CULTURE, BLOOD (ROUTINE X 2): Special Requests: ADEQUATE

## 2021-12-19 ENCOUNTER — Other Ambulatory Visit: Payer: BC Managed Care – PPO

## 2021-12-19 ENCOUNTER — Other Ambulatory Visit: Payer: Self-pay | Admitting: Physician Assistant

## 2021-12-19 DIAGNOSIS — R7989 Other specified abnormal findings of blood chemistry: Secondary | ICD-10-CM | POA: Diagnosis not present

## 2021-12-19 LAB — MISCELLANEOUS TEST

## 2021-12-20 ENCOUNTER — Ambulatory Visit
Admission: RE | Admit: 2021-12-20 | Discharge: 2021-12-20 | Disposition: A | Discharge status: Home / Self Care | Payer: BC Managed Care – PPO | Source: Ambulatory Visit | Attending: Physician Assistant | Admitting: Physician Assistant

## 2021-12-20 DIAGNOSIS — J189 Pneumonia, unspecified organism: Secondary | ICD-10-CM

## 2021-12-20 DIAGNOSIS — J90 Pleural effusion, not elsewhere classified: Secondary | ICD-10-CM | POA: Diagnosis not present

## 2021-12-20 DIAGNOSIS — R059 Cough, unspecified: Secondary | ICD-10-CM | POA: Diagnosis not present

## 2021-12-20 LAB — CBC
Hematocrit: 45.5 % (ref 34.0–46.6)
Hemoglobin: 15.1 g/dL (ref 11.1–15.9)
MCH: 29.5 pg (ref 26.6–33.0)
MCHC: 33.2 g/dL (ref 31.5–35.7)
MCV: 89 fL (ref 79–97)
Platelets: 510 10*3/uL — ABNORMAL HIGH (ref 150–450)
RBC: 5.12 x10E6/uL (ref 3.77–5.28)
RDW: 12.3 % (ref 11.7–15.4)
WBC: 8.3 10*3/uL (ref 3.4–10.8)

## 2021-12-25 NOTE — Patient Instructions (Addendum)
Repeat chest xray in 2 weeks.  Pleural Effusion Pleural effusion is an abnormal buildup of fluid in the layers of tissue between the lungs and the inside of the chest. This space is called the pleural space. The two layers of tissue that line the lungs and the inside of the chest are called pleura. Usually, there is no air in the space between the pleura, only a thin layer of fluid. Some conditions can cause a large amount of fluid to build up, which can cause the lung to collapse if untreated. A pleural effusion is usually caused by another disease that requires treatment. What are the causes? Pleural effusion can be caused by: Conditions that change the pressure in blood vessels, which causes blood and fluid to flow outside their normal areas. These conditions include: Partially collapsed lung tissue (atelectasis). Heart failure. Nephrotic syndrome. This is a kidney condition that causes the body to pass too much protein in urine. Peritoneal dialysis. This is a procedure in which the lining of the stomach is used to filter blood as a treatment for kidney failure. Liver disease (cirrhosis). Fluid buildup caused by damaged tissue or cells in the body. This fluid buildup can be caused by: Fluid in the abdomen (ascites). Connective tissue diseases, such as lupus or rheumatoid arthritis. Certain infections, such as pneumonia or tuberculosis. Cancer. What are the signs or symptoms? In some cases, pleural effusion may cause no symptoms. If symptoms are present, they may include: Shortness of breath, especially when lying down. Chest pain. This may get worse when taking a deep breath. Fever. Dry, long-lasting (chronic) cough. Fatigue. Rapid breathing. Night sweats. An underlying condition that is causing the pleural effusion may also cause other symptoms. These conditions include heart failure, pneumonia, blood clots, tuberculosis, or cancer. How is this diagnosed? This condition may be  diagnosed based on: Your symptoms and medical history. A physical exam. A chest X-ray. A procedure to use a needle to remove fluid from the pleural space (thoracentesis). This fluid is tested. Other imaging studies of the chest, such as an ultrasound or a CT scan. How is this treated? Depending on the cause of your condition, treatment may include: Treating the underlying condition that is causing the pleural effusion. When that condition improves, the effusion will also improve. Examples of treatment for underlying conditions include: Antibiotic medicines to treat an infection. Diuretics or other heart medicines to treat heart failure. Thoracentesis. Placing a thin, flexible tube under your skin and into your chest to continuously drain the effusion (indwelling pleural catheter). Surgery to remove the outer layer of tissue from the pleural space (decortication). A procedure to put medicine into the chest cavity to seal the pleural space and prevent fluid buildup (pleurodesis). Chemotherapy and radiation therapy, if your pleural effusion is caused by cancer. These treatments are typically used to kill certain cells in the body. Follow these instructions at home: Take over-the-counter and prescription medicines only as told by your health care provider. Keep track of how long you are able to do mild exercise, such as walking, before you get short of breath. Write down this information to share with your health care provider. Your ability to exercise should improve over time. Do not use any products that contain nicotine or tobacco. These products include cigarettes, chewing tobacco, and vaping devices, such as e-cigarettes. If you need help quitting, ask your health care provider. Return to your normal activities as told by your health care provider. Ask your health care provider what  activities are safe for you. Keep all follow-up visits. This is important. Contact a health care provider  if: The amount of time that you are able to do mild exercise decreases or does not improve with time. You have a fever. Get help right away if: You are short of breath. You develop chest pain. You develop a new cough. These symptoms may be an emergency. Get help right away. Call 911. Do not wait to see if the symptoms will go away. Do not drive yourself to the hospital. Summary Pleural effusion is an abnormal buildup of fluid in the layers of tissue between the lungs and the inside of the chest. Pleural effusion can have many causes, including heart failure, pulmonary embolism, infections, or cancer. Symptoms of pleural effusion can include shortness of breath, chest pain, fever, long-lasting (chronic) cough, hiccups, or rapid breathing. Diagnosis often involves making images of the chest (such as with ultrasound or X-ray) and removing fluid (thoracentesis) to send for testing. Treatment for pleural effusion depends on the underlying condition that is causing it. This information is not intended to replace advice given to you by your health care provider. Make sure you discuss any questions you have with your health care provider. Document Revised: 01/29/2021 Document Reviewed: 01/29/2021 Elsevier Patient Education  2023 [employer].

## 2021-12-25 NOTE — Progress Notes (Signed)
Established patient visit   Patient: Robyn Roberts   DOB: 02/19/1956   66 y.o. Female  MRN: [id number] Visit Date: 12/26/2021  Chief Complaint  Patient presents with   Follow-up   Subjective    HPI  Patient presents for follow-up on pneumonia. Patient reports feeling much better. Sometimes does have shortness of breath where she needs to take a deep breath but not frequently. Cough has improved and mild. No fever or wheezing. No new symptoms.      Medications: Outpatient Medications Prior to Visit  Medication Sig   famotidine (PEPCID) 20 MG tablet TAKE 1 TABLET AT BEDTIME (Patient taking differently: Take 20 mg by mouth at bedtime.)   ibuprofen (ADVIL) 200 MG tablet Take 600 mg by mouth every 6 (six) hours as needed for moderate pain.   loratadine (CLARITIN) 10 MG tablet Take 10 mg by mouth daily.   Melatonin 10 MG TABS Take 1 tablet by mouth as needed. For sleep and get the sustained release tablets to help you stay asleep (Patient taking differently: Take 10 mg by mouth at bedtime. For sleep and get the sustained release tablets to help you stay asleep)   MULTIPLE VITAMIN PO Take 1 tablet by mouth daily.   olopatadine (PATANOL) 0.1 % ophthalmic solution Place 1 drop into both eyes 2 (two) times daily. (Patient taking differently: Place 1 drop into both eyes daily as needed for allergies.)   polyethylene glycol powder (GLYCOLAX/MIRALAX) 17 GM/SCOOP powder Take 17 g by mouth 2 (two) times daily as needed. (Patient taking differently: Take 17 g by mouth daily.)   traZODone (DESYREL) 100 MG tablet TAKE ONE AND ONE-HALF TABLETS (150 MG) AT BEDTIME AS NEEDED FOR SLEEP. (Patient taking differently: Take 150 mg by mouth at bedtime. TAKE ONE AND ONE-HALF TABLETS (150 MG) AT BEDTIME AS NEEDED FOR SLEEP.)   No facility-administered medications prior to visit.    Review of Systems Review of Systems:  A fourteen system review of systems was performed and found to be positive as per  HPI.   Last CBC Lab Results  Component Value Date   WBC 8.3 12/19/2021   HGB 15.1 12/19/2021   HCT 45.5 12/19/2021   MCV 89 12/19/2021   MCH 29.5 12/19/2021   RDW 12.3 12/19/2021   PLT 510 (H) 81/19/1478   Last metabolic panel Lab Results  Component Value Date   GLUCOSE 94 12/05/2021   NA 141 12/05/2021   K 4.1 12/05/2021   CL 100 12/05/2021   CO2 24 12/05/2021   BUN 13 12/05/2021   CREATININE 0.76 12/05/2021   EGFR 87 12/05/2021   CALCIUM 9.2 12/05/2021   PROT 6.2 12/05/2021   ALBUMIN 3.7 (L) 12/05/2021   LABGLOB 2.5 12/05/2021   AGRATIO 1.5 12/05/2021   BILITOT 0.6 12/05/2021   ALKPHOS 94 12/05/2021   AST 18 12/05/2021   ALT 22 12/05/2021   ANIONGAP 9 12/01/2021   Last lipids Lab Results  Component Value Date   CHOL 125 11/29/2020   HDL 52 11/29/2020   LDLCALC 55 11/29/2020   TRIG 95 11/29/2020   CHOLHDL 2.4 11/29/2020   Last hemoglobin A1c Lab Results  Component Value Date   HGBA1C 6.0 (H) 11/29/2020   Last thyroid functions Lab Results  Component Value Date   TSH 2.720 11/29/2020   T3TOTAL 132 05/12/2019   Last vitamin D Lab Results  Component Value Date   VD25OH 41.2 05/12/2019     Objective    BP 113/78  Pulse 95   Temp 97.7 F (36.5 C)   Ht _0  (1.753 m)   Wt 180 lb (81.6 kg)   SpO2 97%   BMI 26.58 kg/m  BP Readings from Last 3 Encounters:  12/26/21 113/78  12/05/21 118/72  12/01/21 (!) 143/81   Wt Readings from Last 3 Encounters:  12/26/21 180 lb (81.6 kg)  12/05/21 181 lb (82.1 kg)  11/28/21 185 lb (83.9 kg)    Physical Exam  General:  Well Developed, well nourished, appropriate for stated age.  Neuro:  Alert and oriented,  extra-ocular muscles intact  HEENT:  Normocephalic, atraumatic, neck supple  Skin:  no gross rash, warm, pink. Cardiac:  RRR, S1 S2 Respiratory: No wheezing or rales. Mild crackles right lower lobe. Aeration much improved.  Vascular:  Ext warm, no cyanosis apprec.; no edema  Psych:  No HI/SI,  judgement and insight good, Euthymic mood. Full Affect.   No results found for any visits on 12/26/21.  Assessment & Plan      Problem List Items Addressed This Visit       Respiratory   CAP (community acquired pneumonia) - Primary   Relevant Orders   CBC w/Diff   Comp Met (CMET)   DG Chest 2 View   Other Visit Diagnoses     Pleural effusion on right       Relevant Medications   furosemide (LASIX) 20 MG tablet   Other Relevant Orders   DG Chest 2 View      CAP: -Significant clinical improvement. Antibiotic (Augmentin) has been completed. Will provide work note to return June 12th 2023. Will repeat CBC to continue to trend WBC and recheck elevated platelets. Discussed with patient elevated platelets likely secondary as a reactant phase. Discussed recent chest xray which shows no evidence of acute infiltrate. There is a small pleural effusion which was similar to chest CT from 11/29/2021. Will start diuretic therapy for a few days and repeat chest x-ray in 2 weeks to re-evaluate pleural effusion.   Return if symptoms worsen or fail to improve.        Lorrene Reid, PA-C  [facility] at [facility] [phone] (phone) [phone] (fax)  [place]

## 2021-12-26 ENCOUNTER — Ambulatory Visit: Payer: BC Managed Care – PPO | Admitting: Physician Assistant

## 2021-12-26 ENCOUNTER — Encounter: Payer: Self-pay | Admitting: Physician Assistant

## 2021-12-26 VITALS — BP 113/78 | HR 95 | Temp 97.7°F | Ht 69.0 in | Wt 180.0 lb

## 2021-12-26 DIAGNOSIS — J189 Pneumonia, unspecified organism: Secondary | ICD-10-CM | POA: Diagnosis not present

## 2021-12-26 DIAGNOSIS — J90 Pleural effusion, not elsewhere classified: Secondary | ICD-10-CM | POA: Diagnosis not present

## 2021-12-26 MED ORDER — FUROSEMIDE 20 MG PO TABS: 20.0000 mg | ORAL_TABLET | Freq: Every day | ORAL | 0 refills | Status: DC

## 2021-12-27 LAB — COMPREHENSIVE METABOLIC PANEL
ALT: 26 IU/L (ref 0–32)
AST: 16 IU/L (ref 0–40)
Albumin/Globulin Ratio: 2 (ref 1.2–2.2)
Albumin: 4.5 g/dL (ref 3.8–4.8)
Alkaline Phosphatase: 91 IU/L (ref 44–121)
BUN/Creatinine Ratio: 14 (ref 12–28)
BUN: 13 mg/dL (ref 8–27)
Bilirubin Total: 0.5 mg/dL (ref 0.0–1.2)
CO2: 22 mmol/L (ref 20–29)
Calcium: 9.8 mg/dL (ref 8.7–10.3)
Chloride: 104 mmol/L (ref 96–106)
Creatinine, Ser: 0.93 mg/dL (ref 0.57–1.00)
Globulin, Total: 2.3 g/dL (ref 1.5–4.5)
Glucose: 100 mg/dL — ABNORMAL HIGH (ref 70–99)
Potassium: 4.7 mmol/L (ref 3.5–5.2)
Sodium: 142 mmol/L (ref 134–144)
Total Protein: 6.8 g/dL (ref 6.0–8.5)
eGFR: 69 mL/min/{1.73_m2} (ref >59)

## 2021-12-27 LAB — CBC WITH DIFFERENTIAL/PLATELET
Basophils Absolute: 0.1 10*3/uL (ref 0.0–0.2)
Basos: 2 %
EOS (ABSOLUTE): 0.3 10*3/uL (ref 0.0–0.4)
Eos: 4 %
Hematocrit: 45.2 % (ref 34.0–46.6)
Hemoglobin: 15.2 g/dL (ref 11.1–15.9)
Immature Grans (Abs): 0 10*3/uL (ref 0.0–0.1)
Immature Granulocytes: 0 %
Lymphocytes Absolute: 1.4 10*3/uL (ref 0.7–3.1)
Lymphs: 18 %
MCH: 29.4 pg (ref 26.6–33.0)
MCHC: 33.6 g/dL (ref 31.5–35.7)
MCV: 87 fL (ref 79–97)
Monocytes Absolute: 0.6 10*3/uL (ref 0.1–0.9)
Monocytes: 7 %
Neutrophils Absolute: 5.4 10*3/uL (ref 1.4–7.0)
Neutrophils: 69 %
Platelets: 309 10*3/uL (ref 150–450)
RBC: 5.17 x10E6/uL (ref 3.77–5.28)
RDW: 11.8 % (ref 11.7–15.4)
WBC: 7.8 10*3/uL (ref 3.4–10.8)

## 2022-01-09 ENCOUNTER — Ambulatory Visit
Admission: RE | Admit: 2022-01-09 | Discharge: 2022-01-09 | Disposition: A | Discharge status: Home / Self Care | Payer: BC Managed Care – PPO | Source: Ambulatory Visit | Attending: Physician Assistant | Admitting: Physician Assistant

## 2022-01-09 DIAGNOSIS — J189 Pneumonia, unspecified organism: Secondary | ICD-10-CM

## 2022-01-09 DIAGNOSIS — Z8701 Personal history of pneumonia (recurrent): Secondary | ICD-10-CM | POA: Diagnosis not present

## 2022-01-09 DIAGNOSIS — J90 Pleural effusion, not elsewhere classified: Secondary | ICD-10-CM | POA: Diagnosis not present

## 2022-01-09 DIAGNOSIS — M47814 Spondylosis without myelopathy or radiculopathy, thoracic region: Secondary | ICD-10-CM | POA: Diagnosis not present

## 2022-01-10 ENCOUNTER — Encounter: Payer: Self-pay | Admitting: Physician Assistant

## 2022-01-13 ENCOUNTER — Other Ambulatory Visit: Payer: Self-pay | Admitting: Physician Assistant

## 2022-01-13 DIAGNOSIS — K219 Gastro-esophageal reflux disease without esophagitis: Secondary | ICD-10-CM

## 2022-01-13 DIAGNOSIS — G4700 Insomnia, unspecified: Secondary | ICD-10-CM

## 2022-01-13 DIAGNOSIS — Z791 Long term (current) use of non-steroidal anti-inflammatories (NSAID): Secondary | ICD-10-CM

## 2022-01-20 IMAGING — MG DIGITAL SCREENING BILAT W/ TOMO W/ CAD
8 series · 8 of 24 positions shown · non-contrast
Comparison: Previous exam(s).

CLINICAL DATA: Screening.

EXAM:
DIGITAL SCREENING BILATERAL MAMMOGRAM WITH TOMO AND CAD

[L MLO synth-2D]
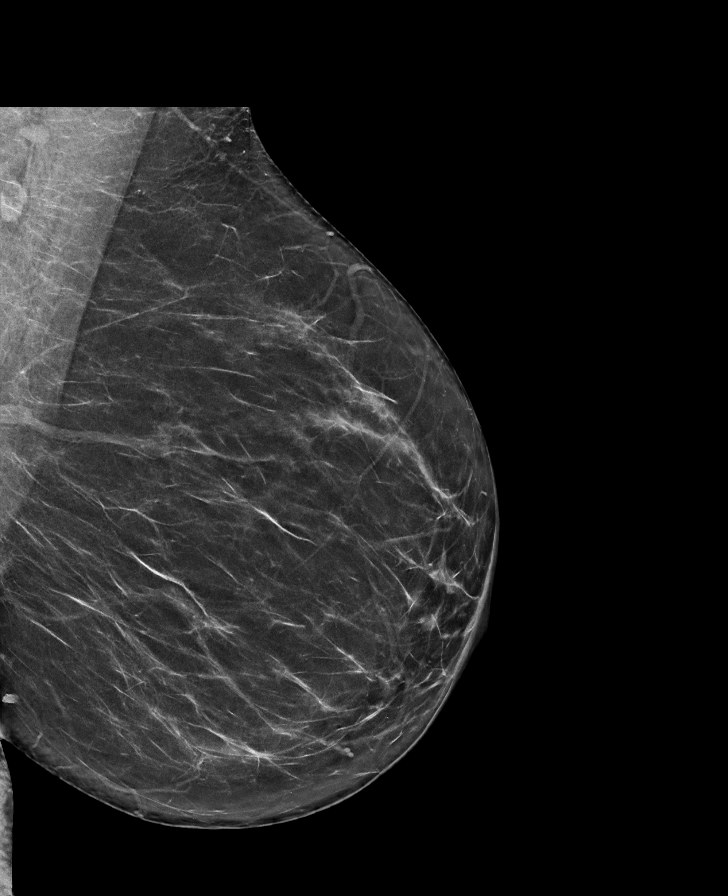

[R MLO synth-2D]
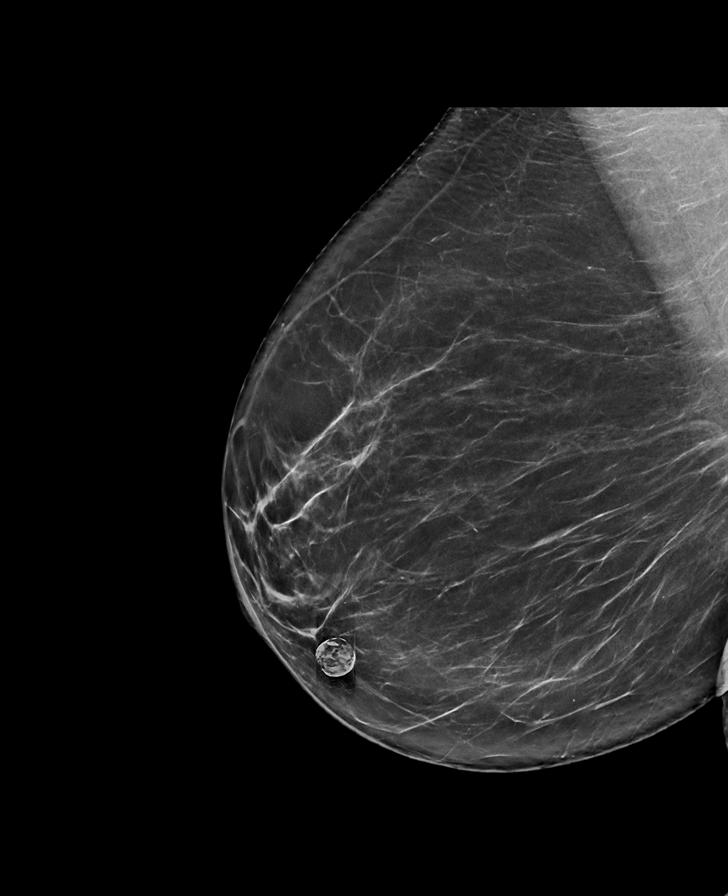

[L CC synth-2D]
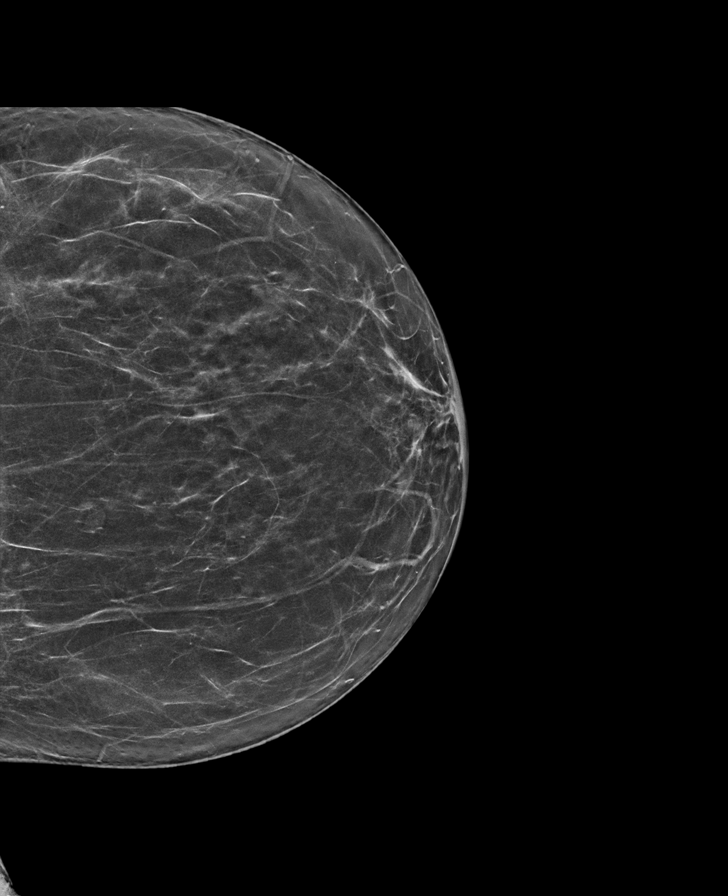

[R CC synth-2D]
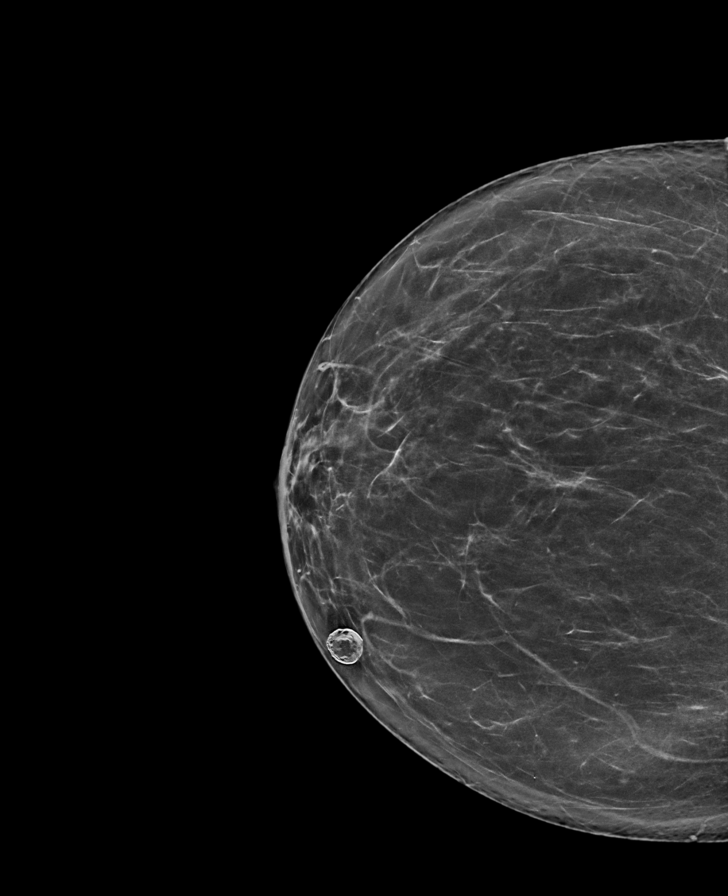

[L CC tomo · tomo slice 35/68.0]
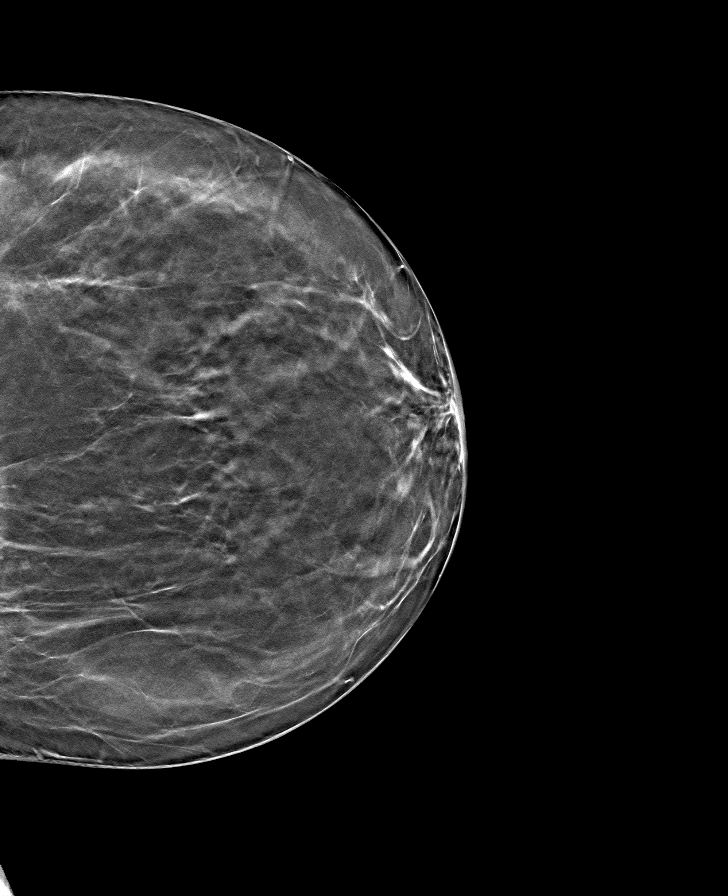

[R MLO tomo · tomo slice 42/83.0]
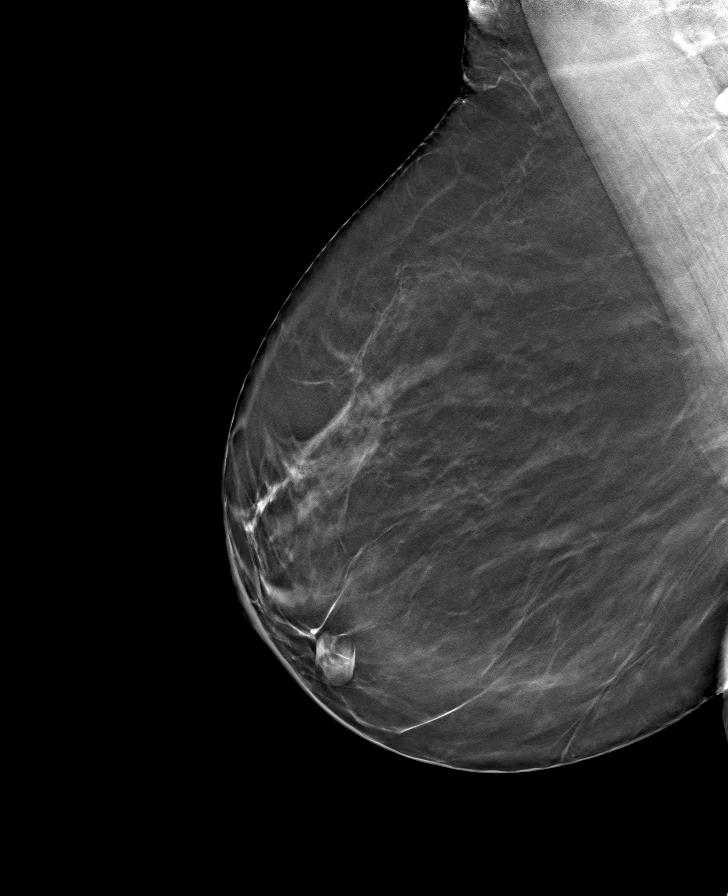

[R CC tomo · tomo slice 37/73.0]
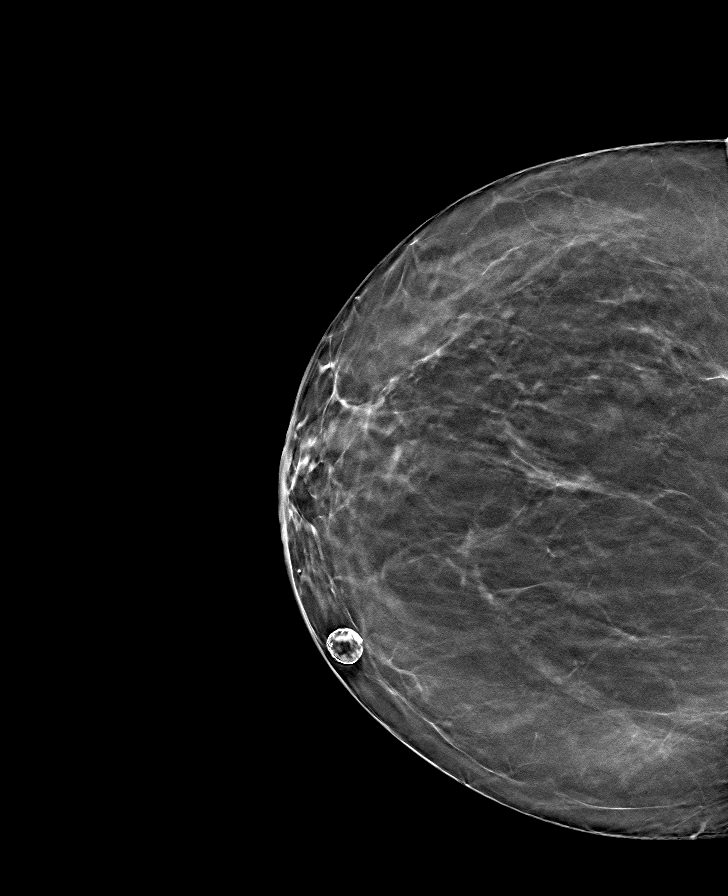

[L MLO tomo · tomo slice 37/73.0]
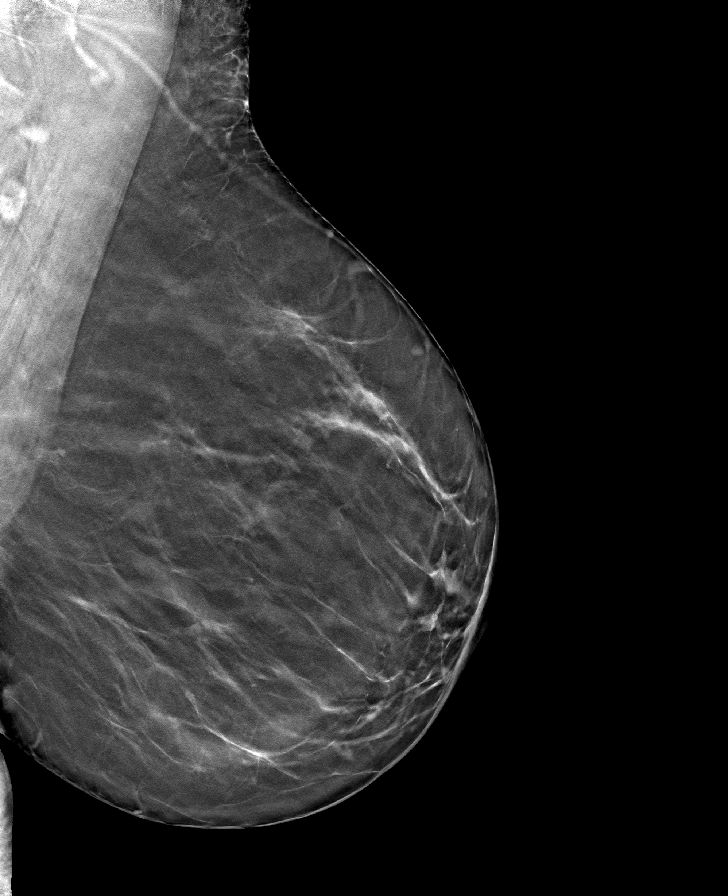

[8 of 24 positions shown; findings below may reference images not displayed]

ACR Breast Density Category b: There are scattered areas of
fibroglandular density.
FINDINGS: There are no findings suspicious for malignancy. Images were
processed with CAD.
IMPRESSION: No mammographic evidence of malignancy. A result letter of this
screening mammogram will be mailed directly to the patient.

RECOMMENDATION:
Screening mammogram in one year. (Code:[id number])

BI-RADS CATEGORY  1: Negative.

## 2022-02-10 ENCOUNTER — Other Ambulatory Visit: Payer: Self-pay | Admitting: Physician Assistant

## 2022-02-10 DIAGNOSIS — M159 Polyosteoarthritis, unspecified: Secondary | ICD-10-CM

## 2022-02-11 ENCOUNTER — Ambulatory Visit (INDEPENDENT_AMBULATORY_CARE_PROVIDER_SITE_OTHER): Payer: BC Managed Care – PPO | Admitting: Physician Assistant

## 2022-02-11 ENCOUNTER — Encounter: Payer: Self-pay | Admitting: Physician Assistant

## 2022-02-11 VITALS — BP 105/67 | HR 80 | Ht 69.0 in | Wt 182.4 lb

## 2022-02-11 DIAGNOSIS — J0140 Acute pansinusitis, unspecified: Secondary | ICD-10-CM

## 2022-02-11 MED ORDER — AZITHROMYCIN 250 MG PO TABS: ORAL_TABLET | ORAL | 0 refills | Status: AC

## 2022-02-11 MED ORDER — DM-GUAIFENESIN ER 30-600 MG PO TB12: 1.0000 | ORAL_TABLET | Freq: Two times a day (BID) | ORAL | 0 refills | Status: DC | PRN

## 2022-02-11 NOTE — Progress Notes (Signed)
Established patient acute visit   Patient: Robyn Roberts   DOB: January 04, 1956   66 y.o. Female  MRN: [id number] Visit Date: 02/11/2022  Chief Complaint  Patient presents with   Nasal Congestion   Subjective    HPI  Patient calls in with c/o head congestion, sinus pressure, nasal congestion, runny nose, ear fullness and cough with clear sputum for at least 14 days. No fever, chills, shortness of breath, altered mental status or chest pain. Has been taking Dayquil with minimal relief.    Medications: Outpatient Medications Prior to Visit  Medication Sig   famotidine (PEPCID) 20 MG tablet TAKE 1 TABLET AT BEDTIME   furosemide (LASIX) 20 MG tablet Take 1 tablet (20 mg total) by mouth daily for 4 days.   ibuprofen (ADVIL) 200 MG tablet Take 600 mg by mouth every 6 (six) hours as needed for moderate pain.   loratadine (CLARITIN) 10 MG tablet Take 10 mg by mouth daily.   Melatonin 10 MG TABS Take 1 tablet by mouth as needed. For sleep and get the sustained release tablets to help you stay asleep (Patient taking differently: Take 10 mg by mouth at bedtime. For sleep and get the sustained release tablets to help you stay asleep)   MULTIPLE VITAMIN PO Take 1 tablet by mouth daily.   olopatadine (PATANOL) 0.1 % ophthalmic solution Place 1 drop into both eyes 2 (two) times daily. (Patient taking differently: Place 1 drop into both eyes daily as needed for allergies.)   polyethylene glycol powder (GLYCOLAX/MIRALAX) 17 GM/SCOOP powder Take 17 g by mouth 2 (two) times daily as needed. (Patient taking differently: Take 17 g by mouth daily.)   traZODone (DESYREL) 100 MG tablet TAKE ONE AND ONE-HALF TABLETS AT BEDTIME AS NEEDED FOR SLEEP   No facility-administered medications prior to visit.    Review of Systems Review of Systems:  A fourteen system review of systems was performed and found to be positive as per HPI.  Last CBC Lab Results  Component Value Date   WBC 7.8 12/26/2021   HGB 15.2  12/26/2021   HCT 45.2 12/26/2021   MCV 87 12/26/2021   MCH 29.4 12/26/2021   RDW 11.8 12/26/2021   PLT 309 82/95/6213   Last metabolic panel Lab Results  Component Value Date   GLUCOSE 100 (H) 12/26/2021   NA 142 12/26/2021   K 4.7 12/26/2021   CL 104 12/26/2021   CO2 22 12/26/2021   BUN 13 12/26/2021   CREATININE 0.93 12/26/2021   EGFR 69 12/26/2021   CALCIUM 9.8 12/26/2021   PROT 6.8 12/26/2021   ALBUMIN 4.5 12/26/2021   LABGLOB 2.3 12/26/2021   AGRATIO 2.0 12/26/2021   BILITOT 0.5 12/26/2021   ALKPHOS 91 12/26/2021   AST 16 12/26/2021   ALT 26 12/26/2021   ANIONGAP 9 12/01/2021   Last lipids Lab Results  Component Value Date   CHOL 125 11/29/2020   HDL 52 11/29/2020   LDLCALC 55 11/29/2020   TRIG 95 11/29/2020   CHOLHDL 2.4 11/29/2020   Last hemoglobin A1c Lab Results  Component Value Date   HGBA1C 6.0 (H) 11/29/2020   Last thyroid functions Lab Results  Component Value Date   TSH 2.720 11/29/2020   T3TOTAL 132 05/12/2019   Last vitamin D Lab Results  Component Value Date   VD25OH 41.2 05/12/2019     Objective    BP 105/67   Pulse 80   Ht '5\' 9"'  (1.753 m)   Wt 182 lb  6.4 oz (82.7 kg)   SpO2 97%   BMI 26.94 kg/m    Physical Exam  General:  In no acute distress, non-toxic appearing, appropriate for stated age.  Neuro:  Alert and oriented,  extra-ocular muscles intact  HEENT:  Normocephalic, atraumatic, PERRL, tenderness of maxillary and ethmoid sinus, fluid behind both TM's, no redness of both TM's, pale nasal mucosa, mild erythema of posterior oropharynx, +cervical adenopathy (right submandibular gland) Skin:  no gross rash, warm, pink. Cardiac:  RRR, S1 S2 Respiratory: CTA B/L w/o wheezing, rhonchi or rales.  Vascular:  Ext warm, no cyanosis apprec.; cap RF less 2 sec. Psych:  No HI/SI, judgement and insight good, Euthymic mood. Full Affect.   No results found for any visits on 02/11/22.  Assessment & Plan     Patient presenting with  upper respiratory symptoms for >10 days with minimal improvement with supportive care so will start antibiotic therapy with Azithromycin for bacterial sinus infection. Recommend to take decongestant twice daily as needed. Advised patient to let me know if symptoms fail to improve or resolve after completing antibiotic therapy and will consider ceftriaxone injection. Patient hospitalized this year with CAP.    Return if symptoms worsen or fail to improve.        Lorrene Reid, PA-C  [facility] at [facility] [phone] (phone) [phone] (fax)  [place]

## 2022-03-13 ENCOUNTER — Other Ambulatory Visit: Payer: BC Managed Care – PPO

## 2022-03-13 DIAGNOSIS — Z0000 Encounter for general adult medical examination without abnormal findings: Secondary | ICD-10-CM | POA: Diagnosis not present

## 2022-03-13 DIAGNOSIS — R7989 Other specified abnormal findings of blood chemistry: Secondary | ICD-10-CM | POA: Diagnosis not present

## 2022-03-13 DIAGNOSIS — E559 Vitamin D deficiency, unspecified: Secondary | ICD-10-CM

## 2022-03-14 LAB — COMPREHENSIVE METABOLIC PANEL
ALT: 24 IU/L (ref 0–32)
AST: 17 IU/L (ref 0–40)
Albumin/Globulin Ratio: 2.4 — ABNORMAL HIGH (ref 1.2–2.2)
Albumin: 4.5 g/dL (ref 3.9–4.9)
Alkaline Phosphatase: 97 IU/L (ref 44–121)
BUN/Creatinine Ratio: 26 (ref 12–28)
BUN: 21 mg/dL (ref 8–27)
Bilirubin Total: 0.3 mg/dL (ref 0.0–1.2)
CO2: 23 mmol/L (ref 20–29)
Calcium: 9.6 mg/dL (ref 8.7–10.3)
Chloride: 103 mmol/L (ref 96–106)
Creatinine, Ser: 0.82 mg/dL (ref 0.57–1.00)
Globulin, Total: 1.9 g/dL (ref 1.5–4.5)
Glucose: 103 mg/dL — ABNORMAL HIGH (ref 70–99)
Potassium: 4.5 mmol/L (ref 3.5–5.2)
Sodium: 141 mmol/L (ref 134–144)
Total Protein: 6.4 g/dL (ref 6.0–8.5)
eGFR: 80 mL/min/{1.73_m2} (ref >59)

## 2022-03-14 LAB — LIPID PANEL
Chol/HDL Ratio: 2.4 ratio (ref 0.0–4.4)
Cholesterol, Total: 131 mg/dL (ref 100–199)
HDL: 55 mg/dL (ref >39)
LDL Chol Calc (NIH): 53 mg/dL (ref 0–99)
Triglycerides: 133 mg/dL (ref 0–149)
VLDL Cholesterol Cal: 23 mg/dL (ref 5–40)

## 2022-03-14 LAB — CBC
Hematocrit: 43.8 % (ref 34.0–46.6)
Hemoglobin: 14.5 g/dL (ref 11.1–15.9)
MCH: 29.8 pg (ref 26.6–33.0)
MCHC: 33.1 g/dL (ref 31.5–35.7)
MCV: 90 fL (ref 79–97)
Platelets: 247 10*3/uL (ref 150–450)
RBC: 4.86 x10E6/uL (ref 3.77–5.28)
RDW: 12.7 % (ref 11.7–15.4)
WBC: 8.3 10*3/uL (ref 3.4–10.8)

## 2022-03-14 LAB — HEMOGLOBIN A1C
Est. average glucose Bld gHb Est-mCnc: 117 mg/dL
Hgb A1c MFr Bld: 5.7 % — ABNORMAL HIGH (ref 4.8–5.6)

## 2022-03-14 LAB — VITAMIN D 25 HYDROXY (VIT D DEFICIENCY, FRACTURES): Vit D, 25-Hydroxy: 43.4 ng/mL (ref 30.0–100.0)

## 2022-03-14 LAB — TSH: TSH: 2.03 u[IU]/mL (ref 0.450–4.500)

## 2022-03-19 NOTE — Progress Notes (Unsigned)
Complete physical exam   Patient: Robyn Roberts   DOB: May 14, 1956   66 y.o. Female  MRN: [id number] Visit Date: 03/20/2022   No chief complaint on file.  Subjective    Robyn Roberts is a 66 y.o. female who presents today for a complete physical exam.  She reports consuming a {diet types:17450} diet. {Exercise:19826} She generally feels {well/fairly well/poorly:18703}. She {does/does not:200015} have additional problems to discuss today.   ***  Past Medical History:  Diagnosis Date   Arthritis    Chronic back pain    Scoliosis    Past Surgical History:  Procedure Laterality Date   CESAREAN SECTION  1991   FOOT SURGERY Bilateral 1985   bunionectomy   Social History   Socioeconomic History   Marital status: Married    Spouse name: Not on file   Number of children: Not on file   Years of education: Not on file   Highest education level: Not on file  Occupational History   Not on file  Tobacco Use   Smoking status: Never   Smokeless tobacco: Never  Vaping Use   Vaping Use: Never used  Substance and Sexual Activity   Alcohol use: Yes    Comment: rare   Drug use: No   Sexual activity: Yes  Other Topics Concern   Not on file  Social History Narrative   Not on file   Social Determinants of Health   Financial Resource Strain: Not on file  Food Insecurity: Not on file  Transportation Needs: Not on file  Physical Activity: Not on file  Stress: Not on file  Social Connections: Not on file  Intimate Partner Violence: Not on file     Medications: Outpatient Medications Prior to Visit  Medication Sig   dextromethorphan-guaiFENesin (MUCINEX DM) 30-600 MG 12hr tablet Take 1 tablet by mouth 2 (two) times daily as needed.   famotidine (PEPCID) 20 MG tablet TAKE 1 TABLET AT BEDTIME   furosemide (LASIX) 20 MG tablet Take 1 tablet (20 mg total) by mouth daily for 4 days.   ibuprofen (ADVIL) 200 MG tablet Take 600 mg by mouth every 6 (six) hours as needed for  moderate pain.   loratadine (CLARITIN) 10 MG tablet Take 10 mg by mouth daily.   Melatonin 10 MG TABS Take 1 tablet by mouth as needed. For sleep and get the sustained release tablets to help you stay asleep (Patient taking differently: Take 10 mg by mouth at bedtime. For sleep and get the sustained release tablets to help you stay asleep)   MULTIPLE VITAMIN PO Take 1 tablet by mouth daily.   olopatadine (PATANOL) 0.1 % ophthalmic solution Place 1 drop into both eyes 2 (two) times daily. (Patient taking differently: Place 1 drop into both eyes daily as needed for allergies.)   polyethylene glycol powder (GLYCOLAX/MIRALAX) 17 GM/SCOOP powder Take 17 g by mouth 2 (two) times daily as needed. (Patient taking differently: Take 17 g by mouth daily.)   traZODone (DESYREL) 100 MG tablet TAKE ONE AND ONE-HALF TABLETS AT BEDTIME AS NEEDED FOR SLEEP   No facility-administered medications prior to visit.    Review of Systems Review of Systems:  A fourteen system review of systems was performed and found to be positive as per HPI.  Last CBC Lab Results  Component Value Date   WBC 8.3 03/13/2022   HGB 14.5 03/13/2022   HCT 43.8 03/13/2022   MCV 90 03/13/2022   MCH 29.8 03/13/2022  RDW 12.7 03/13/2022   PLT 247 49/67/5916   Last metabolic panel Lab Results  Component Value Date   GLUCOSE 103 (H) 03/13/2022   NA 141 03/13/2022   K 4.5 03/13/2022   CL 103 03/13/2022   CO2 23 03/13/2022   BUN 21 03/13/2022   CREATININE 0.82 03/13/2022   EGFR 80 03/13/2022   CALCIUM 9.6 03/13/2022   PROT 6.4 03/13/2022   ALBUMIN 4.5 03/13/2022   LABGLOB 1.9 03/13/2022   AGRATIO 2.4 (H) 03/13/2022   BILITOT 0.3 03/13/2022   ALKPHOS 97 03/13/2022   AST 17 03/13/2022   ALT 24 03/13/2022   ANIONGAP 9 12/01/2021   Last lipids Lab Results  Component Value Date   CHOL 131 03/13/2022   HDL 55 03/13/2022   LDLCALC 53 03/13/2022   TRIG 133 03/13/2022   CHOLHDL 2.4 03/13/2022   Last hemoglobin A1c Lab  Results  Component Value Date   HGBA1C 5.7 (H) 03/13/2022   Last thyroid functions Lab Results  Component Value Date   TSH 2.030 03/13/2022   T3TOTAL 132 05/12/2019   Last vitamin D Lab Results  Component Value Date   VD25OH 43.4 03/13/2022      Objective    There were no vitals taken for this visit. BP Readings from Last 3 Encounters:  02/11/22 105/67  12/26/21 113/78  12/05/21 118/72   Wt Readings from Last 3 Encounters:  02/11/22 182 lb 6.4 oz (82.7 kg)  12/26/21 180 lb (81.6 kg)  12/05/21 181 lb (82.1 kg)      Physical Exam   General Appearance:       Alert, cooperative, in no acute distress, appears stated age   Head:    Normocephalic, without obvious abnormality, atraumatic  Eyes:    PERRL, conjunctiva/corneas clear, EOM's intact, fundi    benign, both eyes  Ears:    Normal TM's and external ear canals, both ears  Nose:   Nares normal, septum midline, mucosa normal, no drainage    or sinus tenderness  Throat:   Lips, mucosa, and tongue normal; teeth and gums normal  Neck:   Supple, symmetrical, trachea midline, no adenopathy;    thyroid:  no enlargement/tenderness/nodules; no JVD  Back:     Symmetric, no curvature, ROM normal, no CVA tenderness  Lungs:     Clear to auscultation bilaterally, respirations unlabored  Chest Wall:    No tenderness or deformity   Heart:    Normal heart rate. Normal rhythm. No murmurs, rubs, or gallops.   Breast Exam:    {Exam; breast:13139::"deferred"}  Abdomen:     Soft, non-tender, bowel sounds active all four quadrants,    no masses, no organomegaly  Pelvic:    {pelvic exam:16852::"deferred"}  Extremities:   All extremities are intact. No cyanosis or edema  Pulses:   2+ and symmetric all extremities  Skin:   Skin color, texture, turgor normal, no rashes or lesions  Lymph nodes:   Cervical and supraclavicular nodes normal  Neurologic:   CNII-XII grossly intact.     Last depression screening scores    12/26/2021   10:01  AM 12/05/2021    9:58 AM 07/11/2021    8:30 AM  PHQ 2/9 Scores  PHQ - 2 Score 0 0 0  PHQ- 9 Score 0  1   Last fall risk screening    12/26/2021   10:01 AM  [place] in the past year? 0  Number falls in past yr: 0  Injury  with Fall? 0  Risk for fall due to : No Fall Risks  Follow up Falls evaluation completed     No results found for any visits on 03/20/22.  Assessment & Plan    Routine Health Maintenance and Physical Exam  Exercise Activities and Dietary recommendations -Discussed heart healthy diet low in fat and carbohydrates. Recommend moderate exercise 150 mins/wk.  Immunization History  Administered Date(s) Administered   Influenza,inj,Quad PF,6+ Mos 04/20/2017, 04/23/2017   Influenza-Unspecified 05/05/2016   Moderna Sars-Covid-2 Vaccination 07/23/2019, 08/19/2019   Td 07/21/2008   Tdap 05/12/2019   Zoster Recombinat (Shingrix) 05/12/2019, 12/06/2020    Health Maintenance  Topic Date Due   Hepatitis C Screening  Never done   COVID-19 Vaccine (3 - Moderna risk series) 09/16/2019   PAP SMEAR-Modifier  06/14/2021   INFLUENZA VACCINE  02/18/2022   MAMMOGRAM  09/13/2023   COLONOSCOPY (Pts 45-37yr Insurance coverage will need to be confirmed)  09/23/2026   TETANUS/TDAP  05/11/2029   HIV Screening  Completed   Zoster Vaccines- Shingrix  Completed   HPV VACCINES  Aged Out    Discussed health benefits of physical activity, and encouraged her to engage in regular exercise appropriate for her age and condition.  Problem List Items Addressed This Visit   None    No follow-ups on file.       MLorrene Reid PA-C  [phone](phone) [phone](fax)  [place]

## 2022-03-20 ENCOUNTER — Encounter: Payer: Self-pay | Admitting: Physician Assistant

## 2022-03-20 ENCOUNTER — Ambulatory Visit (INDEPENDENT_AMBULATORY_CARE_PROVIDER_SITE_OTHER): Payer: BC Managed Care – PPO | Admitting: Physician Assistant

## 2022-03-20 VITALS — BP 118/70 | HR 69 | Temp 97.7°F | Ht 68.5 in | Wt 185.0 lb

## 2022-03-20 DIAGNOSIS — G4700 Insomnia, unspecified: Secondary | ICD-10-CM | POA: Diagnosis not present

## 2022-03-20 DIAGNOSIS — Z0000 Encounter for general adult medical examination without abnormal findings: Secondary | ICD-10-CM | POA: Diagnosis not present

## 2022-03-20 NOTE — Patient Instructions (Signed)
Preventive Care 40-64 Years Old, Female Preventive care refers to lifestyle choices and visits with your health care provider that can promote health and wellness. Preventive care visits are also called wellness exams. What can I expect for my preventive care visit? Counseling Your health care provider may ask you questions about your: Medical history, including: Past medical problems. Family medical history. Pregnancy history. Current health, including: Menstrual cycle. Method of birth control. Emotional well-being. Home life and relationship well-being. Sexual activity and sexual health. Lifestyle, including: Alcohol, nicotine or tobacco, and drug use. Access to firearms. Diet, exercise, and sleep habits. Work and work environment. Sunscreen use. Safety issues such as seatbelt and bike helmet use. Physical exam Your health care provider will check your: Height and weight. These may be used to calculate your BMI (body mass index). BMI is a measurement that tells if you are at a healthy weight. Waist circumference. This measures the distance around your waistline. This measurement also tells if you are at a healthy weight and may help predict your risk of certain diseases, such as type 2 diabetes and high blood pressure. Heart rate and blood pressure. Body temperature. Skin for abnormal spots. What immunizations do I need?  Vaccines are usually given at various ages, according to a schedule. Your health care provider will recommend vaccines for you based on your age, medical history, and lifestyle or other factors, such as travel or where you work. What tests do I need? Screening Your health care provider may recommend screening tests for certain conditions. This may include: Lipid and cholesterol levels. Diabetes screening. This is done by checking your blood sugar (glucose) after you have not eaten for a while (fasting). Pelvic exam and Pap test. Hepatitis B test. Hepatitis C  test. HIV (human immunodeficiency virus) test. STI (sexually transmitted infection) testing, if you are at risk. Lung cancer screening. Colorectal cancer screening. Mammogram. Talk with your health care provider about when you should start having regular mammograms. This may depend on whether you have a family history of breast cancer. BRCA-related cancer screening. This may be done if you have a family history of breast, ovarian, tubal, or peritoneal cancers. Bone density scan. This is done to screen for osteoporosis. Talk with your health care provider about your test results, treatment options, and if necessary, the need for more tests. Follow these instructions at home: Eating and drinking  Eat a diet that includes fresh fruits and vegetables, whole grains, lean protein, and low-fat dairy products. Take vitamin and mineral supplements as recommended by your health care provider. Do not drink alcohol if: Your health care provider tells you not to drink. You are pregnant, may be pregnant, or are planning to become pregnant. If you drink alcohol: Limit how much you have to 0-1 drink a day. Know how much alcohol is in your drink. In the [place] one drink equals one 12 oz bottle of beer (355 mL), one 5 oz glass of wine (148 mL), or one 1 oz glass of hard liquor (44 mL). Lifestyle Brush your teeth every morning and night with fluoride toothpaste. Floss one time each day. Exercise for at least 30 minutes 5 or more days each week. Do not use any products that contain nicotine or tobacco. These products include cigarettes, chewing tobacco, and vaping devices, such as e-cigarettes. If you need help quitting, ask your health care provider. Do not use drugs. If you are sexually active, practice safe sex. Use a condom or other form of protection to   prevent STIs. If you do not wish to become pregnant, use a form of birth control. If you plan to become pregnant, see your health care provider for a  prepregnancy visit. Take aspirin only as told by your health care provider. Make sure that you understand how much to take and what form to take. Work with your health care provider to find out whether it is safe and beneficial for you to take aspirin daily. Find healthy ways to manage stress, such as: Meditation, yoga, or listening to music. Journaling. Talking to a trusted person. Spending time with friends and family. Minimize exposure to UV radiation to reduce your risk of skin cancer. Safety Always wear your seat belt while driving or riding in a vehicle. Do not drive: If you have been drinking alcohol. Do not ride with someone who has been drinking. When you are tired or distracted. While texting. If you have been using any mind-altering substances or drugs. Wear a helmet and other protective equipment during sports activities. If you have firearms in your house, make sure you follow all gun safety procedures. Seek help if you have been physically or sexually abused. What's next? Visit your health care provider once a year for an annual wellness visit. Ask your health care provider how often you should have your eyes and teeth checked. Stay up to date on all vaccines. This information is not intended to replace advice given to you by your health care provider. Make sure you discuss any questions you have with your health care provider. Document Revised: 01/02/2021 Document Reviewed: 01/02/2021 Elsevier Patient Education  [place].

## 2022-04-14 ENCOUNTER — Other Ambulatory Visit: Payer: Self-pay | Admitting: Physician Assistant

## 2022-04-14 DIAGNOSIS — G4700 Insomnia, unspecified: Secondary | ICD-10-CM

## 2022-04-14 DIAGNOSIS — Z791 Long term (current) use of non-steroidal anti-inflammatories (NSAID): Secondary | ICD-10-CM

## 2022-04-14 DIAGNOSIS — K219 Gastro-esophageal reflux disease without esophagitis: Secondary | ICD-10-CM

## 2022-08-11 ENCOUNTER — Other Ambulatory Visit: Payer: Self-pay | Admitting: Nurse Practitioner

## 2022-08-11 DIAGNOSIS — Z1231 Encounter for screening mammogram for malignant neoplasm of breast: Secondary | ICD-10-CM

## 2022-08-14 ENCOUNTER — Ambulatory Visit
Admission: RE | Admit: 2022-08-14 | Discharge: 2022-08-14 | Disposition: A | Discharge status: Home / Self Care | Payer: Medicare HMO | Source: Ambulatory Visit | Attending: Nurse Practitioner | Admitting: Nurse Practitioner

## 2022-08-14 DIAGNOSIS — Z1231 Encounter for screening mammogram for malignant neoplasm of breast: Secondary | ICD-10-CM

## 2022-09-30 DIAGNOSIS — Z01411 Encounter for gynecological examination (general) (routine) with abnormal findings: Secondary | ICD-10-CM | POA: Diagnosis not present

## 2022-09-30 DIAGNOSIS — Z6828 Body mass index (BMI) 28.0-28.9, adult: Secondary | ICD-10-CM | POA: Diagnosis not present

## 2022-09-30 DIAGNOSIS — Z124 Encounter for screening for malignant neoplasm of cervix: Secondary | ICD-10-CM | POA: Diagnosis not present

## 2022-09-30 DIAGNOSIS — Z01419 Encounter for gynecological examination (general) (routine) without abnormal findings: Secondary | ICD-10-CM | POA: Diagnosis not present

## 2022-09-30 DIAGNOSIS — B372 Candidiasis of skin and nail: Secondary | ICD-10-CM | POA: Diagnosis not present

## 2022-09-30 LAB — HM PAP SMEAR: HM Pap smear: NEGATIVE

## 2022-10-01 ENCOUNTER — Ambulatory Visit
Admission: RE | Admit: 2022-10-01 | Discharge: 2022-10-01 | Disposition: A | Discharge status: Home / Self Care | Payer: Medicare HMO | Source: Ambulatory Visit | Attending: Nurse Practitioner | Admitting: Nurse Practitioner

## 2022-10-01 DIAGNOSIS — Z1231 Encounter for screening mammogram for malignant neoplasm of breast: Secondary | ICD-10-CM | POA: Diagnosis not present

## 2022-10-07 NOTE — Progress Notes (Signed)
Negative mammogram. Repeat in one year.

## 2022-12-10 ENCOUNTER — Ambulatory Visit (INDEPENDENT_AMBULATORY_CARE_PROVIDER_SITE_OTHER): Payer: Medicare HMO | Admitting: Family Medicine

## 2022-12-10 ENCOUNTER — Encounter: Payer: Self-pay | Admitting: Family Medicine

## 2022-12-10 VITALS — BP 136/88 | HR 78 | Resp 18 | Ht 68.5 in | Wt 191.0 lb

## 2022-12-10 DIAGNOSIS — H1013 Acute atopic conjunctivitis, bilateral: Secondary | ICD-10-CM

## 2022-12-10 DIAGNOSIS — F410 Panic disorder [episodic paroxysmal anxiety] without agoraphobia: Secondary | ICD-10-CM

## 2022-12-10 MED ORDER — OLOPATADINE HCL 0.1 % OP SOLN: 1.0000 [drp] | Freq: Two times a day (BID) | OPHTHALMIC | 2 refills | Status: DC

## 2022-12-10 MED ORDER — ALPRAZOLAM 0.25 MG PO TABS
ORAL_TABLET | ORAL | 0 refills | Status: AC
Start: 2022-12-10 — End: ?

## 2022-12-10 NOTE — Assessment & Plan Note (Signed)
Patient ran out of olopatadine drops and requested refill.

## 2022-12-10 NOTE — Patient Instructions (Signed)
Take 1/2-1 full tablet as needed for episodes of feeling winded/panic attacks.

## 2022-12-10 NOTE — Progress Notes (Signed)
Acute Office Visit  Subjective:     Patient ID: Robyn Roberts, female    DOB: May 17, 1956, 67 y.o.   MRN: [id number]  Chief Complaint  Patient presents with   Shortness of Breath         HPI Patient is in today for episodes of shortness of breath.  She periodically will have a period of feeling winded typically when she is sitting and doing something like having coffee.  She notices that when she has time to sit by herself and starts thinking shortness of breath, she finds that is difficult to breathe.  She had pneumonia last year and there was some necrosis on chest x-ray.  She finds that she thinks about this often and gets worried.  She does not ever get short of breath when she is exercising or active in the yard or distracted by inactivity.  She denies chest pain, headache, cough, fever, chills, fatigue.  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  ROS Negative unless otherwise noted in HPI.    Objective:    BP 136/88 (BP Location: Left Arm, Patient Position: Sitting, Cuff Size: Normal)   Pulse 78   Resp 18   Ht 5' 8.5" (1.74 m)   Wt 191 lb (86.6 kg)   SpO2 98%   BMI 28.62 kg/m   Physical Exam Constitutional:      General: She is not in acute distress.    Appearance: Normal appearance.  HENT:     Head: Normocephalic and atraumatic.  Eyes:     Extraocular Movements: Extraocular movements intact.  Cardiovascular:     Rate and Rhythm: Normal rate and regular rhythm.     Heart sounds: No murmur heard.    No friction rub. No gallop.  Pulmonary:     Effort: Pulmonary effort is normal. No accessory muscle usage or respiratory distress.     Breath sounds: No wheezing, rhonchi or rales.  Chest:     Chest wall: No mass or deformity.  Musculoskeletal:        General: Normal range of motion.     Cervical back: Normal range of motion.  Skin:    General:  Skin is warm and dry.  Neurological:     General: No focal deficit present.     Mental Status: She is alert and oriented to person, place, and time.     Cranial Nerves: No cranial nerve deficit.  Psychiatric:        Mood and Affect: Mood is anxious.      Assessment & Plan:  Panic attacks Assessment & Plan: No abnormalities on physical exam.  Presentation most likely panic attacks.  We discussed watchful waiting versus as needed medication options including Xanax or hydroxyzine.  She would prefer medication option since this has been occurring for about 4 months now.  We discussed the mechanisms of action, common side effects, and indications for Xanax and hydroxyzine.  She would prefer a trial of a low-dose of Xanax to take as needed for panic attacks since it tends to be a little bit faster to its onset.  If this is adequate, can continue with Xanax 0.125-0.25 mg as needed up to twice a day.  If she continues to have panic attacks daily, will consider SSRI therapy such as Prozac.  Orders: -     ALPRAZolam; Take 10.5-1 tablet (  0.125-0.25 mg total) by mouth 2 (two) times daily as needed for anxiety.  Dispense: 20 tablet; Refill: 0  Allergic conjunctivitis of both eyes Assessment & Plan: Patient ran out of olopatadine drops and requested refill.  Orders: -     Olopatadine HCl; Place 1 drop into both eyes 2 (two) times daily.  Dispense: 5 mL; Refill: 2    Return in about 3 months (around 03/12/2023) for Welcome to Medicare annual wellness visit (in person), fasting blood work 1 week before.  Melida Quitter, PA

## 2022-12-10 NOTE — Assessment & Plan Note (Addendum)
No abnormalities on physical exam.  Presentation most likely panic attacks.  We discussed watchful waiting versus as needed medication options including Xanax or hydroxyzine.  She would prefer medication option since this has been occurring for about 4 months now.  We discussed the mechanisms of action, common side effects, and indications for Xanax and hydroxyzine.  She would prefer a trial of a low-dose of Xanax to take as needed for panic attacks since it tends to be a little bit faster to its onset.  If this is adequate, can continue with Xanax 0.125-0.25 mg as needed up to twice a day.  If she continues to have panic attacks daily, will consider SSRI therapy such as Prozac.

## 2022-12-11 ENCOUNTER — Ambulatory Visit: Payer: Medicare HMO | Admitting: Family Medicine

## 2022-12-11 ENCOUNTER — Encounter: Payer: Self-pay | Admitting: Family Medicine

## 2022-12-18 DIAGNOSIS — K5900 Constipation, unspecified: Secondary | ICD-10-CM | POA: Diagnosis not present

## 2022-12-18 DIAGNOSIS — Z800 Family history of malignant neoplasm of digestive organs: Secondary | ICD-10-CM | POA: Diagnosis not present

## 2022-12-18 DIAGNOSIS — Z1211 Encounter for screening for malignant neoplasm of colon: Secondary | ICD-10-CM | POA: Diagnosis not present

## 2022-12-18 DIAGNOSIS — K5730 Diverticulosis of large intestine without perforation or abscess without bleeding: Secondary | ICD-10-CM | POA: Diagnosis not present

## 2022-12-18 DIAGNOSIS — R635 Abnormal weight gain: Secondary | ICD-10-CM | POA: Diagnosis not present

## 2022-12-18 DIAGNOSIS — K219 Gastro-esophageal reflux disease without esophagitis: Secondary | ICD-10-CM | POA: Diagnosis not present

## 2023-01-01 ENCOUNTER — Telehealth: Payer: Self-pay

## 2023-01-01 DIAGNOSIS — G4700 Insomnia, unspecified: Secondary | ICD-10-CM

## 2023-01-01 MED ORDER — TRAZODONE HCL 100 MG PO TABS
ORAL_TABLET | ORAL | 0 refills | Status: DC
Start: 2023-01-01 — End: 2023-03-04

## 2023-01-01 NOTE — Telephone Encounter (Signed)
Refill sent bridge patient to next OV on 03/16/23

## 2023-01-01 NOTE — Telephone Encounter (Signed)
Pt is requesting a refill on: traZODone (DESYREL) 100 MG tablet     Pharmacy: [id number] - [place] ([place]), [place] - [address]   LOV 1/61/09

## 2023-01-02 ENCOUNTER — Other Ambulatory Visit: Payer: Self-pay | Admitting: Family Medicine

## 2023-01-02 DIAGNOSIS — G4700 Insomnia, unspecified: Secondary | ICD-10-CM

## 2023-02-16 DIAGNOSIS — Z800 Family history of malignant neoplasm of digestive organs: Secondary | ICD-10-CM | POA: Diagnosis not present

## 2023-02-16 DIAGNOSIS — K5730 Diverticulosis of large intestine without perforation or abscess without bleeding: Secondary | ICD-10-CM | POA: Diagnosis not present

## 2023-02-16 DIAGNOSIS — K648 Other hemorrhoids: Secondary | ICD-10-CM | POA: Diagnosis not present

## 2023-02-16 DIAGNOSIS — Z1211 Encounter for screening for malignant neoplasm of colon: Secondary | ICD-10-CM | POA: Diagnosis not present

## 2023-02-26 ENCOUNTER — Other Ambulatory Visit: Payer: Self-pay | Admitting: Family Medicine

## 2023-02-26 DIAGNOSIS — R7989 Other specified abnormal findings of blood chemistry: Secondary | ICD-10-CM

## 2023-02-26 DIAGNOSIS — Z0000 Encounter for general adult medical examination without abnormal findings: Secondary | ICD-10-CM

## 2023-03-04 ENCOUNTER — Other Ambulatory Visit: Payer: Self-pay | Admitting: Family Medicine

## 2023-03-04 DIAGNOSIS — G4700 Insomnia, unspecified: Secondary | ICD-10-CM

## 2023-03-04 MED ORDER — TRAZODONE HCL 100 MG PO TABS
ORAL_TABLET | ORAL | 0 refills | Status: DC
Start: 2023-03-04 — End: 2023-03-16

## 2023-03-10 DIAGNOSIS — Z818 Family history of other mental and behavioral disorders: Secondary | ICD-10-CM | POA: Diagnosis not present

## 2023-03-10 DIAGNOSIS — G4700 Insomnia, unspecified: Secondary | ICD-10-CM | POA: Diagnosis not present

## 2023-03-10 DIAGNOSIS — Z882 Allergy status to sulfonamides status: Secondary | ICD-10-CM | POA: Diagnosis not present

## 2023-03-10 DIAGNOSIS — Z8249 Family history of ischemic heart disease and other diseases of the circulatory system: Secondary | ICD-10-CM | POA: Diagnosis not present

## 2023-03-10 DIAGNOSIS — K219 Gastro-esophageal reflux disease without esophagitis: Secondary | ICD-10-CM | POA: Diagnosis not present

## 2023-03-10 DIAGNOSIS — Z809 Family history of malignant neoplasm, unspecified: Secondary | ICD-10-CM | POA: Diagnosis not present

## 2023-03-10 DIAGNOSIS — H9193 Unspecified hearing loss, bilateral: Secondary | ICD-10-CM | POA: Diagnosis not present

## 2023-03-10 DIAGNOSIS — Z791 Long term (current) use of non-steroidal anti-inflammatories (NSAID): Secondary | ICD-10-CM | POA: Diagnosis not present

## 2023-03-10 DIAGNOSIS — Z880 Allergy status to penicillin: Secondary | ICD-10-CM | POA: Diagnosis not present

## 2023-03-10 DIAGNOSIS — Z823 Family history of stroke: Secondary | ICD-10-CM | POA: Diagnosis not present

## 2023-03-10 DIAGNOSIS — F419 Anxiety disorder, unspecified: Secondary | ICD-10-CM | POA: Diagnosis not present

## 2023-03-10 DIAGNOSIS — M1990 Unspecified osteoarthritis, unspecified site: Secondary | ICD-10-CM | POA: Diagnosis not present

## 2023-03-12 ENCOUNTER — Other Ambulatory Visit: Payer: Medicare HMO

## 2023-03-13 ENCOUNTER — Other Ambulatory Visit: Payer: Medicare HMO

## 2023-03-13 DIAGNOSIS — Z0000 Encounter for general adult medical examination without abnormal findings: Secondary | ICD-10-CM | POA: Diagnosis not present

## 2023-03-13 DIAGNOSIS — R7989 Other specified abnormal findings of blood chemistry: Secondary | ICD-10-CM | POA: Diagnosis not present

## 2023-03-14 LAB — HEMOGLOBIN A1C
Est. average glucose Bld gHb Est-mCnc: 126 mg/dL
Hgb A1c MFr Bld: 6 % — ABNORMAL HIGH (ref 4.8–5.6)

## 2023-03-14 LAB — COMPREHENSIVE METABOLIC PANEL
ALT: 31 IU/L (ref 0–32)
AST: 24 IU/L (ref 0–40)
Albumin: 4.7 g/dL (ref 3.9–4.9)
Alkaline Phosphatase: 105 IU/L (ref 44–121)
BUN/Creatinine Ratio: 19 (ref 12–28)
BUN: 18 mg/dL (ref 8–27)
Bilirubin Total: 0.7 mg/dL (ref 0.0–1.2)
CO2: 22 mmol/L (ref 20–29)
Calcium: 10 mg/dL (ref 8.7–10.3)
Chloride: 103 mmol/L (ref 96–106)
Creatinine, Ser: 0.96 mg/dL (ref 0.57–1.00)
Globulin, Total: 2.3 g/dL (ref 1.5–4.5)
Glucose: 104 mg/dL — ABNORMAL HIGH (ref 70–99)
Potassium: 4.6 mmol/L (ref 3.5–5.2)
Sodium: 141 mmol/L (ref 134–144)
Total Protein: 7 g/dL (ref 6.0–8.5)
eGFR: 66 mL/min/{1.73_m2} (ref >59)

## 2023-03-14 LAB — LIPID PANEL
Chol/HDL Ratio: 2.7 ratio (ref 0.0–4.4)
Cholesterol, Total: 140 mg/dL (ref 100–199)
HDL: 52 mg/dL (ref >39)
LDL Chol Calc (NIH): 61 mg/dL (ref 0–99)
Triglycerides: 161 mg/dL — ABNORMAL HIGH (ref 0–149)
VLDL Cholesterol Cal: 27 mg/dL (ref 5–40)

## 2023-03-14 LAB — TSH: TSH: 2.53 u[IU]/mL (ref 0.450–4.500)

## 2023-03-14 LAB — CBC
Hematocrit: 47.5 % — ABNORMAL HIGH (ref 34.0–46.6)
Hemoglobin: 15.4 g/dL (ref 11.1–15.9)
MCH: 29.8 pg (ref 26.6–33.0)
MCHC: 32.4 g/dL (ref 31.5–35.7)
MCV: 92 fL (ref 79–97)
Platelets: 251 10*3/uL (ref 150–450)
RBC: 5.17 x10E6/uL (ref 3.77–5.28)
RDW: 12.3 % (ref 11.7–15.4)
WBC: 7.9 10*3/uL (ref 3.4–10.8)

## 2023-03-16 ENCOUNTER — Ambulatory Visit (INDEPENDENT_AMBULATORY_CARE_PROVIDER_SITE_OTHER): Payer: Medicare HMO | Admitting: Family Medicine

## 2023-03-16 ENCOUNTER — Encounter: Payer: Self-pay | Admitting: Family Medicine

## 2023-03-16 VITALS — BP 125/81 | HR 70 | Resp 18 | Ht 68.5 in | Wt 188.0 lb

## 2023-03-16 DIAGNOSIS — Z0000 Encounter for general adult medical examination without abnormal findings: Secondary | ICD-10-CM | POA: Diagnosis not present

## 2023-03-16 DIAGNOSIS — G4700 Insomnia, unspecified: Secondary | ICD-10-CM | POA: Diagnosis not present

## 2023-03-16 DIAGNOSIS — D229 Melanocytic nevi, unspecified: Secondary | ICD-10-CM | POA: Diagnosis not present

## 2023-03-16 MED ORDER — TRAZODONE HCL 100 MG PO TABS
ORAL_TABLET | ORAL | 1 refills | Status: DC
Start: 2023-03-16 — End: 2023-07-13

## 2023-03-16 NOTE — Progress Notes (Signed)
Subjective:    Robyn Roberts is a 66 y.o. female who presents for a Welcome to Medicare exam.   Review of Systems Negative Cardiac Risk Factors include: none      Objective:    Today's Vitals   03/16/23 1528  BP: 125/81  Pulse: 70  Resp: 18  SpO2: 97%  Weight: 188 lb (85.3 kg)  Height: 5' 8.5" (1.74 m)  PainSc: 0-No pain  Body mass index is 28.17 kg/m.  Medications Outpatient Encounter Medications as of 03/16/2023  Medication Sig   ALPRAZolam (XANAX) 0.25 MG tablet Take 10.5-1 tablet (0.125-0.25 mg total) by mouth 2 (two) times daily as needed for anxiety.   dextromethorphan-guaiFENesin (MUCINEX DM) 30-600 MG 12hr tablet Take 1 tablet by mouth 2 (two) times daily as needed.   famotidine (PEPCID) 20 MG tablet TAKE 1 TABLET AT BEDTIME   ibuprofen (ADVIL) 200 MG tablet Take 600 mg by mouth every 6 (six) hours as needed for moderate pain.   loratadine (CLARITIN) 10 MG tablet Take 10 mg by mouth daily.   MULTIPLE VITAMIN PO Take 1 tablet by mouth daily.   olopatadine (PATANOL) 0.1 % ophthalmic solution Place 1 drop into both eyes 2 (two) times daily.   polyethylene glycol powder (GLYCOLAX/MIRALAX) 17 GM/SCOOP powder Take 17 g by mouth 2 (two) times daily as needed. (Patient taking differently: Take 17 g by mouth daily.)   [DISCONTINUED] traZODone (DESYREL) 100 MG tablet TAKE ONE AND ONE-HALF TABLETS AT BEDTIME AS NEEDED FOR SLEEP   traZODone (DESYREL) 100 MG tablet TAKE ONE AND ONE-HALF TABLETS AT BEDTIME AS NEEDED FOR SLEEP   No facility-administered encounter medications on file as of 03/16/2023.     History: Past Medical History:  Diagnosis Date   Arthritis    Chronic back pain    Scoliosis    Past Surgical History:  Procedure Laterality Date   CESAREAN SECTION  1991   FOOT SURGERY Bilateral 1985   bunionectomy    Family History  Problem Relation Age of Onset   Heart disease Mother    Colon polyps Mother    Cancer Mother        thyroid   Asthma Mother     Cancer Father        colon   Diabetes Father    Depression Father    CVA Father    Breast cancer Paternal Aunt    Social History   Occupational History   Not on file  Tobacco Use   Smoking status: Never    Passive exposure: Never   Smokeless tobacco: Never  Vaping Use   Vaping status: Never Used  Substance and Sexual Activity   Alcohol use: Yes    Comment: rare   Drug use: No   Sexual activity: Yes    Tobacco Counseling Counseling given: Not Answered   Immunizations and Health Maintenance Immunization History  Administered Date(s) Administered   Influenza,inj,Quad PF,6+ Mos 04/20/2017, 04/23/2017   Influenza-Unspecified 05/05/2016   Moderna Sars-Covid-2 Vaccination 07/23/2019, 08/19/2019   Td 07/21/2008   Tdap 05/12/2019   Zoster Recombinant(Shingrix) 05/12/2019, 12/06/2020   Health Maintenance Due  Topic Date Due   Hepatitis C Screening  Never done   COVID-19 Vaccine (3 - Moderna risk series) 09/16/2019   Pneumonia Vaccine 59+ Years old (1 of 1 - PCV) Never done    Activities of Daily Living    03/16/2023    3:33 PM 03/20/2022    8:57 AM  In your present state of  health, do you have any difficulty performing the following activities:  Hearing? 1 1  Vision? 0 0  Difficulty concentrating or making decisions? 0 0  Walking or climbing stairs? 0 0  Dressing or bathing? 0 0  Doing errands, shopping? 0 0  Preparing Food and eating ? N   Using the Toilet? N   In the past six months, have you accidently leaked urine? N   Do you have problems with loss of bowel control? N   Managing your Medications? N   Managing your Finances? N   Housekeeping or managing your Housekeeping? N    Advanced Directives: Does Patient Have a Medical Advance Directive?: Yes Type of Advance Directive: Healthcare Power of Attorney Does patient want to make changes to medical advance directive?: No - Patient declined Copy of Healthcare Power of Attorney in Chart?: No - copy  requested    Assessment:    This is a routine wellness examination for this patient .   Vision/Hearing screen No results found.  Dietary issues and exercise activities discussed:      Goals       Activity and Exercise Increased (pt-stated)      Evidence-based guidance:  Review current exercise levels.  Assess patient perspective on exercise or activity level, barriers to increasing activity, motivation and readiness for change.  Recommend or set healthy exercise goal based on individual tolerance.  Encourage small steps toward making change in amount of exercise or activity.  Urge reduction of sedentary activities or screen time.  Promote group activities within the community or with family or support person.  Consider referral to rehabiliation therapist for assessment and exercise/activity plan.   Notes:       DIET - INCREASE WATER INTAKE (pt-stated)      Depression Screen    03/16/2023    3:34 PM 12/10/2022   11:12 AM 03/20/2022    8:57 AM 12/26/2021   10:01 AM  PHQ 2/9 Scores  PHQ - 2 Score 0 0 0 0  PHQ- 9 Score  1 0 0     Fall Risk    03/16/2023    3:33 PM  Fall Risk   Falls in the past year? 0  Number falls in past yr: 0  Injury with Fall? 0  Risk for fall due to : No Fall Risks  Follow up Falls evaluation completed    Cognitive Function:        03/16/2023    3:33 PM  6CIT Screen  What Year? 0 points  What month? 0 points  What time? 0 points  Count back from 20 0 points  Months in reverse 0 points  Repeat phrase 0 points  Total Score 0 points    Patient Care Team: Melida Quitter, PA as PCP - General (Family Medicine) Lynden Ang, NP as Nurse Practitioner (Obstetrics and Gynecology) Zenovia Jordan, MD as Consulting Physician (Rheumatology) Charna Elizabeth, MD as Consulting Physician (Gastroenterology)     Plan:   Reviewed most recent labs including CBC, CMP, lipid panel, A1C, TSH, and vitamin D. All within normal limits/stable from last  check other than triglycerides elevated at 161, A1c 6.0 which is an increase from 5.7.  We discussed maintaining routine physical activity and eating a balanced diet limiting carbs and sugary foods.  Provided a handout about ideas of different food groups to create a balanced plate.  Patient also has a new mole on her back that is difficult for her to see and she  does not know when it first appeared.  She would like a referral to dermatology to establish care and be evaluated.  I have personally reviewed and noted the following in the patient's chart:   Medical and social history Use of alcohol, tobacco or illicit drugs  Current medications and supplements Functional ability and status Nutritional status Physical activity Advanced directives List of other physicians Hospitalizations, surgeries, and ER visits in previous 12 months Vitals Screenings to include cognitive, depression, and falls Referrals and appointments  In addition, I have reviewed and discussed with patient certain preventive protocols, quality metrics, and best practice recommendations. A written personalized care plan for preventive services as well as general preventive health recommendations were provided to patient.     Melida Quitter, [place] 03/16/2023

## 2023-03-16 NOTE — Patient Instructions (Addendum)
We will check your A1c for blood sugar levels and your cholesterol levels again in about 6 months.  Until then, keep up the good work getting routine physical activity and eating a balanced diet.  I have included a printout with some examples of some of those different food groups that we discussed.  If you can pair a carbohydrate with fiber, protein, and/or a healthy fats, that will keep your blood sugar more steady!

## 2023-03-16 NOTE — Progress Notes (Signed)
Annual Wellness Visit     Patient: Robyn Roberts, Female    DOB: [birth year], 67 y.o.   MRN: [id number]  Subjective  Chief Complaint  Patient presents with   Medicare Wellness    Robyn Roberts is a 67 y.o. female who presents today for her Annual Wellness Visit. She reports consuming a general diet. Home exercise routine includes walking 1.6 hrs per week. She generally feels well. She reports sleeping well. She does not have additional problems to discuss today.   HPI       Medications: Outpatient Medications Prior to Visit  Medication Sig   ALPRAZolam (XANAX) 0.25 MG tablet Take 10.5-1 tablet (0.125-0.25 mg total) by mouth 2 (two) times daily as needed for anxiety.   dextromethorphan-guaiFENesin (MUCINEX DM) 30-600 MG 12hr tablet Take 1 tablet by mouth 2 (two) times daily as needed.   famotidine (PEPCID) 20 MG tablet TAKE 1 TABLET AT BEDTIME   ibuprofen (ADVIL) 200 MG tablet Take 600 mg by mouth every 6 (six) hours as needed for moderate pain.   loratadine (CLARITIN) 10 MG tablet Take 10 mg by mouth daily.   MULTIPLE VITAMIN PO Take 1 tablet by mouth daily.   olopatadine (PATANOL) 0.1 % ophthalmic solution Place 1 drop into both eyes 2 (two) times daily.   polyethylene glycol powder (GLYCOLAX/MIRALAX) 17 GM/SCOOP powder Take 17 g by mouth 2 (two) times daily as needed. (Patient taking differently: Take 17 g by mouth daily.)   [DISCONTINUED] traZODone (DESYREL) 100 MG tablet TAKE ONE AND ONE-HALF TABLETS AT BEDTIME AS NEEDED FOR SLEEP   No facility-administered medications prior to visit.    Allergies  Allergen Reactions   Aspirin Swelling   Sulfa Antibiotics Hives    Patient Care Team: Melida Quitter, PA as PCP - General (Family Medicine) Lynden Ang, NP as Nurse Practitioner (Obstetrics and Gynecology) Zenovia Jordan, MD as Consulting Physician (Rheumatology) Charna Elizabeth, MD as Consulting Physician (Gastroenterology)        Objective  BP 125/81 (BP  Location: Left Arm, Patient Position: Sitting, Cuff Size: Normal)   Pulse 70   Resp 18   Ht 5' 8.5" (1.74 m)   Wt 188 lb (85.3 kg)   SpO2 97%   BMI 28.17 kg/m    Physical Exam    Most recent functional status assessment:    03/16/2023    3:33 PM  In your present state of health, do you have any difficulty performing the following activities:  Hearing? 1  Vision? 0  Difficulty concentrating or making decisions? 0  Walking or climbing stairs? 0  Dressing or bathing? 0  Doing errands, shopping? 0  Preparing Food and eating ? N  Using the Toilet? N  In the past six months, have you accidently leaked urine? N  Do you have problems with loss of bowel control? N  Managing your Medications? N  Managing your Finances? N  Housekeeping or managing your Housekeeping? N   Most recent fall risk assessment:    03/16/2023    3:33 PM  Fall Risk   Falls in the past year? 0  Number falls in past yr: 0  Injury with Fall? 0  Risk for fall due to : No Fall Risks  Follow up Falls evaluation completed    Most recent depression screenings:    03/16/2023    3:34 PM 12/10/2022   11:12 AM  PHQ 2/9 Scores  PHQ - 2 Score 0 0  PHQ- 9 Score  1  Most recent cognitive screening:    03/16/2023    3:33 PM  6CIT Screen  What Year? 0 points  What month? 0 points  What time? 0 points  Count back from 20 0 points  Months in reverse 0 points  Repeat phrase 0 points  Total Score 0 points   Most recent Audit-C alcohol use screening    03/16/2023    3:32 PM  Alcohol Use Disorder Test (AUDIT)  1. How often do you have a drink containing alcohol? 0  2. How many drinks containing alcohol do you have on a typical day when you are drinking? 0  3. How often do you have six or more drinks on one occasion? 0  AUDIT-C Score 0   A score of 3 or more in women, and 4 or more in men indicates increased risk for alcohol abuse, EXCEPT if all of the points are from question 1   Vision/Hearing  Screen: No results found.    No results found for any visits on 03/16/23.    Assessment & Plan   Annual wellness visit done today including the all of the following: Reviewed patient's Family Medical History Reviewed and updated list of patient's medical providers Assessment of cognitive impairment was done Assessed patient's functional ability Established a written schedule for health screening services Health Risk Assessent Completed and Reviewed  Exercise Activities and Dietary recommendations  Goals       Activity and Exercise Increased (pt-stated)      Evidence-based guidance:  Review current exercise levels.  Assess patient perspective on exercise or activity level, barriers to increasing activity, motivation and readiness for change.  Recommend or set healthy exercise goal based on individual tolerance.  Encourage small steps toward making change in amount of exercise or activity.  Urge reduction of sedentary activities or screen time.  Promote group activities within the community or with family or support person.  Consider referral to rehabiliation therapist for assessment and exercise/activity plan.   Notes:       DIET - INCREASE WATER INTAKE (pt-stated)        Immunization History  Administered Date(s) Administered   Influenza,inj,Quad PF,6+ Mos 04/20/2017, 04/23/2017   Influenza-Unspecified 05/05/2016   Moderna Sars-Covid-2 Vaccination 07/23/2019, 08/19/2019   Td 07/21/2008   Tdap 05/12/2019   Zoster Recombinant(Shingrix) 05/12/2019, 12/06/2020    Health Maintenance  Topic Date Due   Hepatitis C Screening  Never done   COVID-19 Vaccine (3 - Moderna risk series) 09/16/2019   Pneumonia Vaccine 57+ Years old (1 of 1 - PCV) Never done   INFLUENZA VACCINE  10/19/2023 (Originally 02/19/2023)   Medicare Annual Wellness (AWV)  03/15/2024   MAMMOGRAM  09/30/2024   PAP SMEAR-Modifier  09/29/2025   Colonoscopy  09/23/2026   DTaP/Tdap/Td (3 - Td or Tdap)  05/11/2029   DEXA SCAN  Completed   HIV Screening  Completed   Zoster Vaccines- Shingrix  Completed   HPV VACCINES  Aged Out     Discussed health benefits of physical activity, and encouraged her to engage in regular exercise appropriate for her age and condition.    Problem List Items Addressed This Visit       Other   Insomnia (Chronic)   Relevant Medications   traZODone (DESYREL) 100 MG tablet   Other Visit Diagnoses     Welcome to Medicare preventive visit    -  Primary   Relevant Orders   EKG 12-Lead (Completed)   Encounter for Medicare annual wellness  exam       Multiple nevi       Relevant Orders   Ambulatory referral to Dermatology       Return in about 6 months (around 09/16/2023) for follow-up for HLD and prediabetes, fasting blood work 1 week before.     Tonny Bollman, CMA

## 2023-07-11 ENCOUNTER — Other Ambulatory Visit: Payer: Self-pay | Admitting: Family Medicine

## 2023-07-11 DIAGNOSIS — G4700 Insomnia, unspecified: Secondary | ICD-10-CM

## 2023-08-27 ENCOUNTER — Encounter: Payer: Self-pay | Admitting: Family Medicine

## 2023-08-27 ENCOUNTER — Other Ambulatory Visit: Payer: Self-pay

## 2023-08-27 DIAGNOSIS — R7989 Other specified abnormal findings of blood chemistry: Secondary | ICD-10-CM

## 2023-08-27 DIAGNOSIS — E663 Overweight: Secondary | ICD-10-CM

## 2023-08-31 IMAGING — CR DG CHEST 2V
2 series · 2 of 2 positions shown · non-contrast
Comparison: Chest radiograph dated December 20, 2021, CT dated November 29, 2021

CLINICAL DATA: follow-up on pneumonia and right pleural effusion

EXAM:
CHEST - 2 VIEW

[w chest pa]
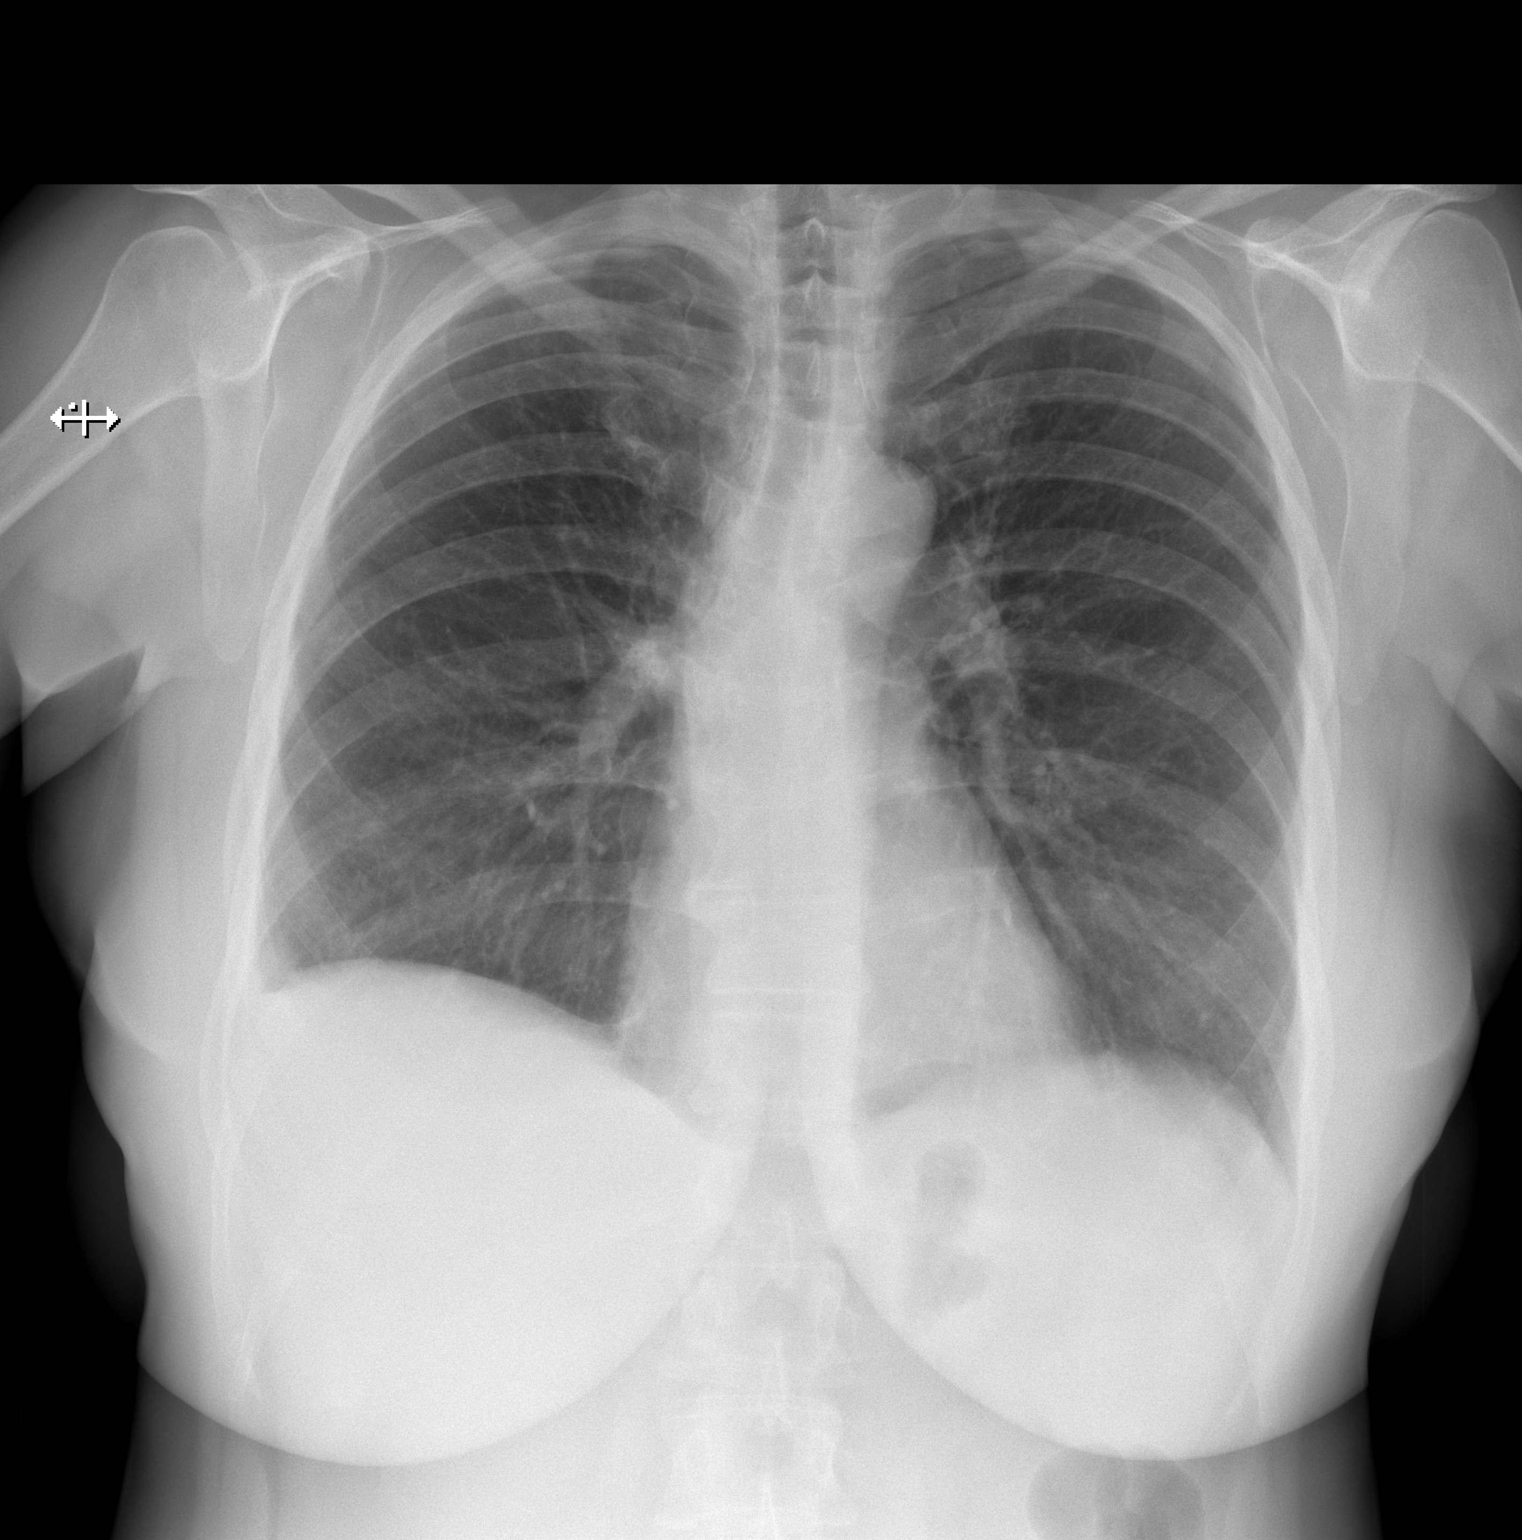

[w chest lat]
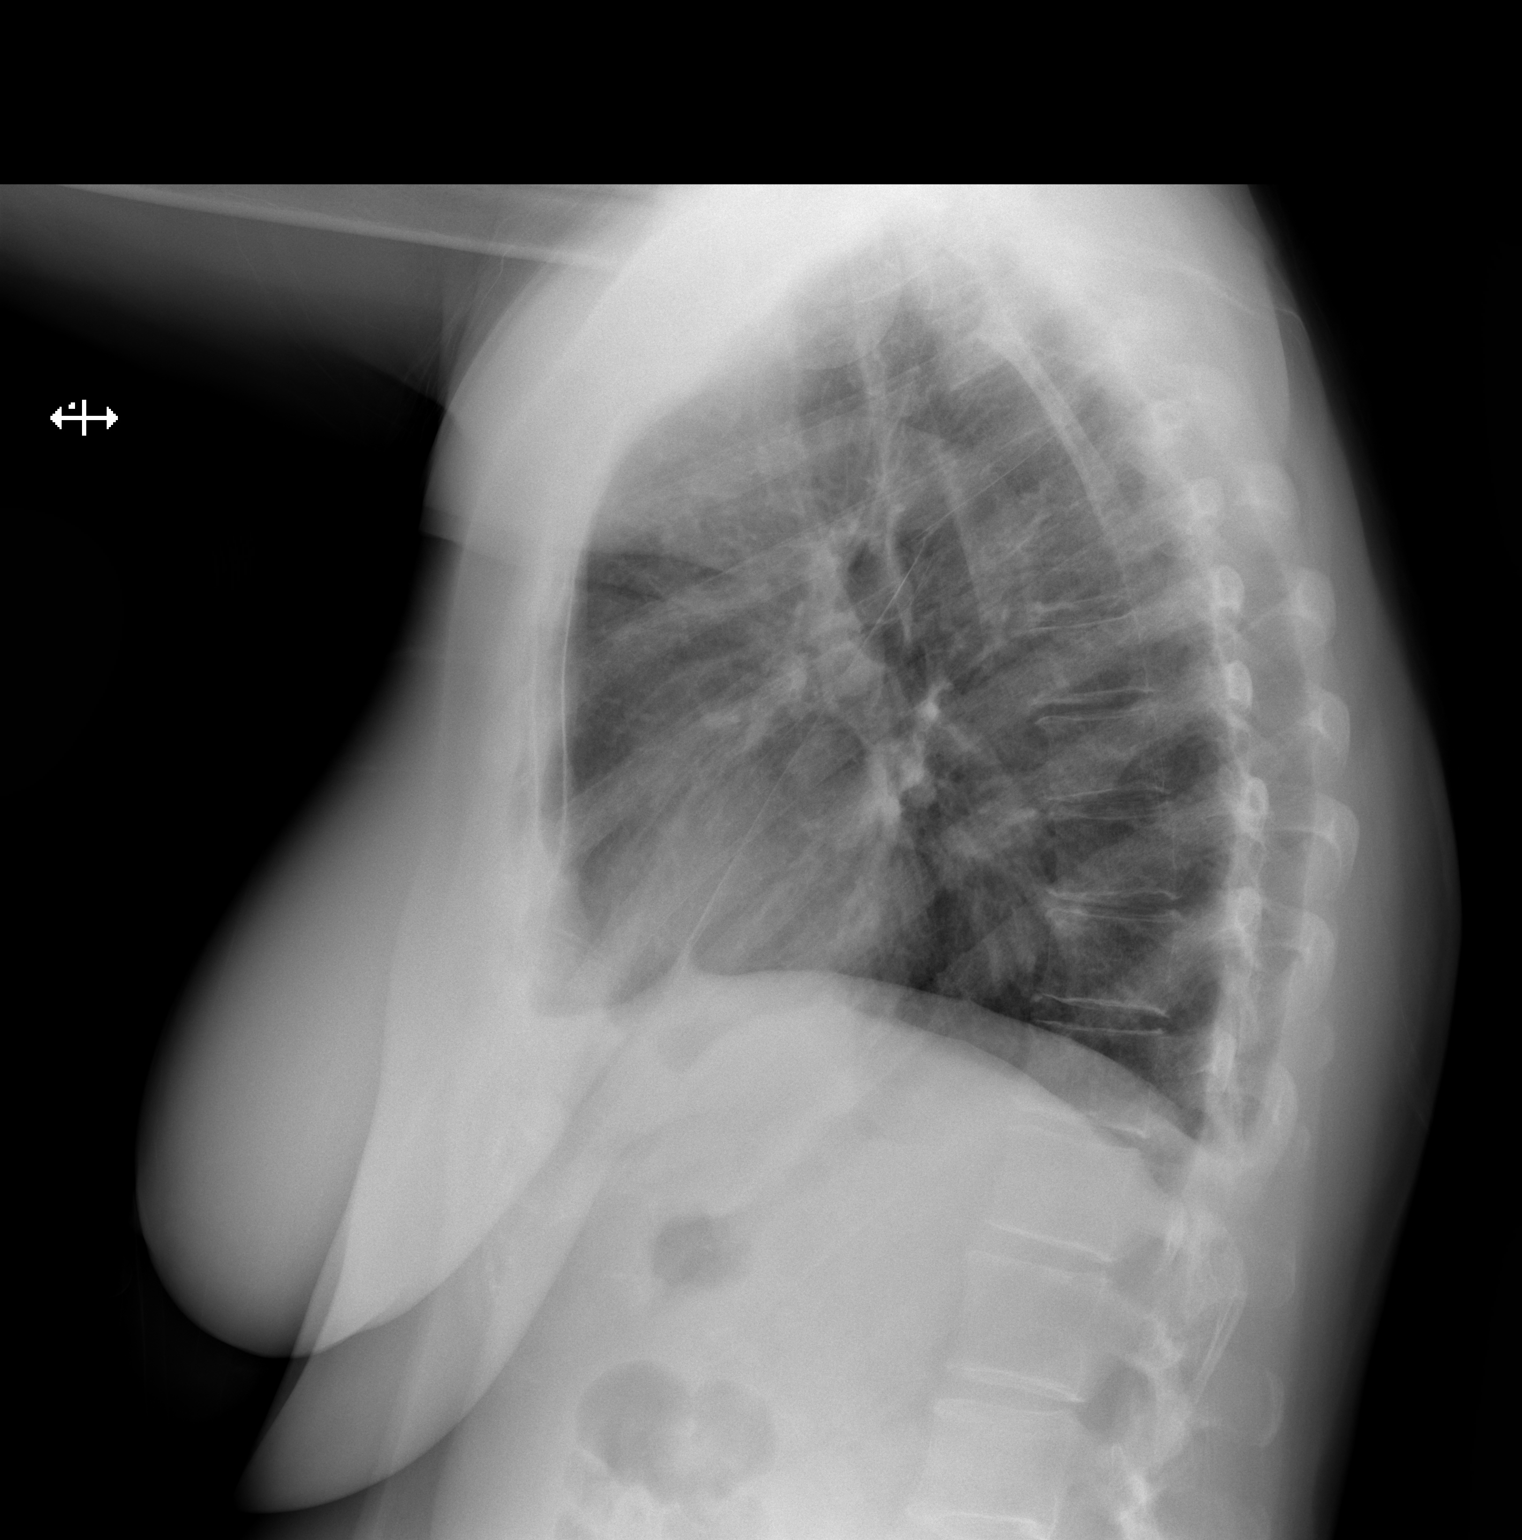

[2 of 2 positions shown; findings below may reference images not displayed]

FINDINGS: The cardiomediastinal silhouette is unchanged in contour. Mildly
decreased RIGHT pleural effusion with trace residual pleural fluid.
No significant LEFT-sided pleural effusion. No pneumothorax. No
acute pleuroparenchymal abnormality. Visualized abdomen is
unremarkable. Minimal degenerative changes of the thoracic spine.
IMPRESSION: Trace residual RIGHT pleural effusion. Previously described RIGHT
middle lobe opacity is not visualized radiographically.

## 2023-09-04 DIAGNOSIS — L820 Inflamed seborrheic keratosis: Secondary | ICD-10-CM | POA: Diagnosis not present

## 2023-09-07 ENCOUNTER — Encounter: Payer: Self-pay | Admitting: Family Medicine

## 2023-09-07 ENCOUNTER — Other Ambulatory Visit: Payer: Self-pay | Admitting: Family Medicine

## 2023-09-07 DIAGNOSIS — G4700 Insomnia, unspecified: Secondary | ICD-10-CM

## 2023-09-07 MED ORDER — TRAZODONE HCL 100 MG PO TABS: ORAL_TABLET | ORAL | 0 refills | Status: DC

## 2023-09-11 ENCOUNTER — Other Ambulatory Visit: Payer: Medicare Other

## 2023-09-11 DIAGNOSIS — R7989 Other specified abnormal findings of blood chemistry: Secondary | ICD-10-CM | POA: Diagnosis not present

## 2023-09-11 DIAGNOSIS — E663 Overweight: Secondary | ICD-10-CM

## 2023-09-11 MED ORDER — TRAZODONE HCL 100 MG PO TABS: ORAL_TABLET | ORAL | 0 refills | Status: DC

## 2023-09-12 LAB — COMPREHENSIVE METABOLIC PANEL
ALT: 34 [IU]/L — ABNORMAL HIGH (ref 0–32)
AST: 23 [IU]/L (ref 0–40)
Albumin: 4.5 g/dL (ref 3.9–4.9)
Alkaline Phosphatase: 106 [IU]/L (ref 44–121)
BUN/Creatinine Ratio: 17 (ref 12–28)
BUN: 14 mg/dL (ref 8–27)
Bilirubin Total: 0.3 mg/dL (ref 0.0–1.2)
CO2: 21 mmol/L (ref 20–29)
Calcium: 9.8 mg/dL (ref 8.7–10.3)
Chloride: 105 mmol/L (ref 96–106)
Creatinine, Ser: 0.84 mg/dL (ref 0.57–1.00)
Globulin, Total: 2.2 g/dL (ref 1.5–4.5)
Glucose: 122 mg/dL — ABNORMAL HIGH (ref 70–99)
Potassium: 4.5 mmol/L (ref 3.5–5.2)
Sodium: 143 mmol/L (ref 134–144)
Total Protein: 6.7 g/dL (ref 6.0–8.5)
eGFR: 77 mL/min/{1.73_m2} (ref >59)

## 2023-09-12 LAB — CBC WITH DIFFERENTIAL/PLATELET
Basophils Absolute: 0.1 10*3/uL (ref 0.0–0.2)
Basos: 1 %
EOS (ABSOLUTE): 0.3 10*3/uL (ref 0.0–0.4)
Eos: 3 %
Hematocrit: 47.4 % — ABNORMAL HIGH (ref 34.0–46.6)
Hemoglobin: 15.9 g/dL (ref 11.1–15.9)
Immature Grans (Abs): 0 10*3/uL (ref 0.0–0.1)
Immature Granulocytes: 0 %
Lymphocytes Absolute: 1.6 10*3/uL (ref 0.7–3.1)
Lymphs: 16 %
MCH: 30.6 pg (ref 26.6–33.0)
MCHC: 33.5 g/dL (ref 31.5–35.7)
MCV: 91 fL (ref 79–97)
Monocytes Absolute: 0.7 10*3/uL (ref 0.1–0.9)
Monocytes: 7 %
Neutrophils Absolute: 7.5 10*3/uL — ABNORMAL HIGH (ref 1.4–7.0)
Neutrophils: 73 %
Platelets: 266 10*3/uL (ref 150–450)
RBC: 5.2 x10E6/uL (ref 3.77–5.28)
RDW: 12 % (ref 11.7–15.4)
WBC: 10.2 10*3/uL (ref 3.4–10.8)

## 2023-09-12 LAB — HEMOGLOBIN A1C
Est. average glucose Bld gHb Est-mCnc: 134 mg/dL
Hgb A1c MFr Bld: 6.3 % — ABNORMAL HIGH (ref 4.8–5.6)

## 2023-09-12 LAB — LIPID PANEL
Chol/HDL Ratio: 2.8 {ratio} (ref 0.0–4.4)
Cholesterol, Total: 141 mg/dL (ref 100–199)
HDL: 50 mg/dL (ref >39)
LDL Chol Calc (NIH): 64 mg/dL (ref 0–99)
Triglycerides: 161 mg/dL — ABNORMAL HIGH (ref 0–149)
VLDL Cholesterol Cal: 27 mg/dL (ref 5–40)

## 2023-09-14 ENCOUNTER — Encounter: Payer: Self-pay | Admitting: Family Medicine

## 2023-09-18 ENCOUNTER — Encounter: Payer: Self-pay | Admitting: Family Medicine

## 2023-09-18 ENCOUNTER — Ambulatory Visit (INDEPENDENT_AMBULATORY_CARE_PROVIDER_SITE_OTHER): Payer: Medicare Other | Admitting: Family Medicine

## 2023-09-18 VITALS — BP 117/76 | HR 79 | Ht 68.5 in | Wt 187.8 lb

## 2023-09-18 DIAGNOSIS — E781 Pure hyperglyceridemia: Secondary | ICD-10-CM | POA: Insufficient documentation

## 2023-09-18 DIAGNOSIS — J019 Acute sinusitis, unspecified: Secondary | ICD-10-CM | POA: Diagnosis not present

## 2023-09-18 DIAGNOSIS — R7302 Impaired glucose tolerance (oral): Secondary | ICD-10-CM | POA: Diagnosis not present

## 2023-09-18 DIAGNOSIS — B9689 Other specified bacterial agents as the cause of diseases classified elsewhere: Secondary | ICD-10-CM

## 2023-09-18 MED ORDER — AMOXICILLIN-POT CLAVULANATE 875-125 MG PO TABS
1.0000 | ORAL_TABLET | Freq: Two times a day (BID) | ORAL | 0 refills | Status: DC
Start: 2023-09-18 — End: 2024-03-18

## 2023-09-18 NOTE — Assessment & Plan Note (Addendum)
 A1c increased to 6.3.  Discussed the importance of staying active and limiting carbs/sugar to prevent A1c from further increasing and progressing to diabetes.

## 2023-09-18 NOTE — Progress Notes (Signed)
 Established Patient Office Visit  Subjective   Patient ID: Robyn Roberts, female    DOB: 1956-04-20  Age: 68 y.o. MRN: [id number]  Chief Complaint  Patient presents with   Hyperlipidemia    HPI Robyn Roberts is a 68 y.o. female presenting today for follow up of hyperlipidemia, prediabetes.  She also endorses URI symptoms that have been ongoing for about 2 weeks.  She complains of nasal congestion, ear fullness, "swimmy head", and sinus pressure.  Symptoms initially started to improve but within the past 2-3 days have worsened significantly. Hyperlipidemia: Currently consuming a general diet.  She typically walks a couple of hours each week.  She got back into a more consistent walking routine within the past week or so. The 10-year ASCVD risk score (Arnett DK, et al., 2019) is: 4.6% Prediabetes: denies hypoglycemic events, wounds or sores that are not healing well, increased thirst or urination.   Outpatient Medications Prior to Visit  Medication Sig   ALPRAZolam (XANAX) 0.25 MG tablet Take 10.5-1 tablet (0.125-0.25 mg total) by mouth 2 (two) times daily as needed for anxiety.   famotidine (PEPCID) 20 MG tablet TAKE 1 TABLET AT BEDTIME   ibuprofen (ADVIL) 200 MG tablet Take 600 mg by mouth every 6 (six) hours as needed for moderate pain.   loratadine (CLARITIN) 10 MG tablet Take 10 mg by mouth daily.   MULTIPLE VITAMIN PO Take 1 tablet by mouth daily.   olopatadine (PATANOL) 0.1 % ophthalmic solution Place 1 drop into both eyes 2 (two) times daily.   polyethylene glycol powder (GLYCOLAX/MIRALAX) 17 GM/SCOOP powder Take 17 g by mouth 2 (two) times daily as needed. (Patient taking differently: Take 17 g by mouth daily.)   traZODone (DESYREL) 100 MG tablet TAKE 1 & 1/2 (ONE & ONE-HALF) TABLETS BY MOUTH AT BEDTIME AS NEEDED FOR SLEEP   [DISCONTINUED] dextromethorphan-guaiFENesin (MUCINEX DM) 30-600 MG 12hr tablet Take 1 tablet by mouth 2 (two) times daily as needed.   No  facility-administered medications prior to visit.    ROS Negative unless otherwise noted in HPI   Objective:     BP 117/76   Pulse 79   Ht 5' 8.5" (1.74 m)   Wt 187 lb 12 oz (85.2 kg)   SpO2 95%   BMI 28.13 kg/m   Physical Exam Constitutional:      General: She is not in acute distress.    Appearance: Normal appearance. She is not ill-appearing.  HENT:     Head: Normocephalic and atraumatic.     Right Ear: Tympanic membrane, ear canal and external ear normal.     Left Ear: Tympanic membrane, ear canal and external ear normal.     Nose: No congestion or rhinorrhea.     Right Sinus: Maxillary sinus tenderness and frontal sinus tenderness present.     Left Sinus: Maxillary sinus tenderness and frontal sinus tenderness present.     Mouth/Throat:     Mouth: Mucous membranes are moist.     Pharynx: Oropharynx is clear. No oropharyngeal exudate or posterior oropharyngeal erythema.  Eyes:     General:        Right eye: No discharge.        Left eye: No discharge.     Conjunctiva/sclera: Conjunctivae normal.     Pupils: Pupils are equal, round, and reactive to light.  Cardiovascular:     Rate and Rhythm: Normal rate and regular rhythm.     Heart sounds: No murmur heard.  No friction rub. No gallop.  Pulmonary:     Effort: Pulmonary effort is normal. No respiratory distress.     Breath sounds: Normal breath sounds. No wheezing, rhonchi or rales.  Skin:    General: Skin is warm and dry.  Neurological:     Mental Status: She is alert and oriented to person, place, and time.      Assessment & Plan:  Hypertriglyceridemia Assessment & Plan: Triglycerides elevated but stable at 161. The 10-year ASCVD risk score (Arnett DK, et al., 2019) is: 4.6%, continue low-fat diet and making efforts to stay physically active.  Will continue to monitor.   Glucose intolerance (impaired glucose tolerance) Assessment & Plan: A1c increased to 6.3.  Discussed the importance of staying  active and limiting carbs/sugar to prevent A1c from further increasing and progressing to diabetes.   Acute bacterial sinusitis -     Amoxicillin-Pot Clavulanate; Take 1 tablet by mouth 2 (two) times daily.  Dispense: 14 tablet; Refill: 0  History and physical exam consistent with acute bacterial rhinosinusitis.  Continue symptomatic management with OTC medications, sending prescription for 7-day course of Augmentin.  Return in about 6 months (around 03/17/2024) for annual physical, fasting labs 1 week before.    Melida Quitter, PA

## 2023-09-18 NOTE — Assessment & Plan Note (Addendum)
 Triglycerides elevated but stable at 161. The 10-year ASCVD risk score (Arnett DK, et al., 2019) is: 4.6%, continue low-fat diet and making efforts to stay physically active.  Will continue to monitor.

## 2023-10-01 ENCOUNTER — Encounter: Payer: Self-pay | Admitting: Family Medicine

## 2023-10-01 ENCOUNTER — Other Ambulatory Visit: Payer: Self-pay | Admitting: Family Medicine

## 2023-10-01 DIAGNOSIS — Z791 Long term (current) use of non-steroidal anti-inflammatories (NSAID): Secondary | ICD-10-CM

## 2023-10-01 DIAGNOSIS — K219 Gastro-esophageal reflux disease without esophagitis: Secondary | ICD-10-CM

## 2023-10-01 MED ORDER — FAMOTIDINE 20 MG PO TABS: 20.0000 mg | ORAL_TABLET | Freq: Every day | ORAL | 3 refills | Status: AC

## 2023-10-01 MED ORDER — MELOXICAM 15 MG PO TABS: 15.0000 mg | ORAL_TABLET | Freq: Every day | ORAL | 0 refills | Status: DC

## 2023-10-02 DIAGNOSIS — Z1331 Encounter for screening for depression: Secondary | ICD-10-CM | POA: Diagnosis not present

## 2023-10-02 DIAGNOSIS — Z01419 Encounter for gynecological examination (general) (routine) without abnormal findings: Secondary | ICD-10-CM | POA: Diagnosis not present

## 2023-10-05 ENCOUNTER — Other Ambulatory Visit: Payer: Self-pay | Admitting: Obstetrics and Gynecology

## 2023-10-05 DIAGNOSIS — E2839 Other primary ovarian failure: Secondary | ICD-10-CM

## 2023-11-11 ENCOUNTER — Other Ambulatory Visit: Payer: Self-pay | Admitting: Family Medicine

## 2023-11-11 ENCOUNTER — Encounter: Payer: Self-pay | Admitting: Family Medicine

## 2023-11-11 DIAGNOSIS — G4700 Insomnia, unspecified: Secondary | ICD-10-CM

## 2023-11-11 MED ORDER — TRAZODONE HCL 100 MG PO TABS: ORAL_TABLET | ORAL | 0 refills | Status: DC

## 2023-12-28 ENCOUNTER — Other Ambulatory Visit: Payer: Self-pay

## 2023-12-28 DIAGNOSIS — Z1231 Encounter for screening mammogram for malignant neoplasm of breast: Secondary | ICD-10-CM

## 2024-01-05 ENCOUNTER — Ambulatory Visit
Admission: RE | Admit: 2024-01-05 | Discharge: 2024-01-05 | Disposition: A | Discharge status: Home / Self Care | Source: Ambulatory Visit

## 2024-01-05 DIAGNOSIS — K080 Exfoliation of teeth due to systemic causes: Secondary | ICD-10-CM | POA: Diagnosis not present

## 2024-01-05 DIAGNOSIS — Z1231 Encounter for screening mammogram for malignant neoplasm of breast: Secondary | ICD-10-CM

## 2024-01-07 ENCOUNTER — Ambulatory Visit: Payer: Self-pay

## 2024-01-08 ENCOUNTER — Other Ambulatory Visit: Payer: Self-pay

## 2024-01-08 DIAGNOSIS — G4700 Insomnia, unspecified: Secondary | ICD-10-CM

## 2024-01-08 MED ORDER — TRAZODONE HCL 100 MG PO TABS: ORAL_TABLET | ORAL | 0 refills | Status: DC

## 2024-02-29 DIAGNOSIS — K080 Exfoliation of teeth due to systemic causes: Secondary | ICD-10-CM | POA: Diagnosis not present

## 2024-03-07 ENCOUNTER — Other Ambulatory Visit: Payer: Self-pay

## 2024-03-07 DIAGNOSIS — Z1159 Encounter for screening for other viral diseases: Secondary | ICD-10-CM

## 2024-03-07 DIAGNOSIS — Z13228 Encounter for screening for other metabolic disorders: Secondary | ICD-10-CM

## 2024-03-07 NOTE — Progress Notes (Signed)
fa

## 2024-03-10 ENCOUNTER — Other Ambulatory Visit

## 2024-03-11 ENCOUNTER — Other Ambulatory Visit

## 2024-03-11 DIAGNOSIS — Z13228 Encounter for screening for other metabolic disorders: Secondary | ICD-10-CM | POA: Diagnosis not present

## 2024-03-11 DIAGNOSIS — Z130 Encounter for screening for diseases of the blood and blood-forming organs and certain disorders involving the immune mechanism: Secondary | ICD-10-CM

## 2024-03-11 DIAGNOSIS — Z1159 Encounter for screening for other viral diseases: Secondary | ICD-10-CM

## 2024-03-11 DIAGNOSIS — Z1321 Encounter for screening for nutritional disorder: Secondary | ICD-10-CM | POA: Diagnosis not present

## 2024-03-11 DIAGNOSIS — Z1329 Encounter for screening for other suspected endocrine disorder: Secondary | ICD-10-CM | POA: Diagnosis not present

## 2024-03-12 LAB — CBC WITH DIFFERENTIAL/PLATELET
Basophils Absolute: 0 x10E3/uL (ref 0.0–0.2)
Basos: 0 %
EOS (ABSOLUTE): 0.2 x10E3/uL (ref 0.0–0.4)
Eos: 2 %
Hematocrit: 47.9 % — ABNORMAL HIGH (ref 34.0–46.6)
Hemoglobin: 15.7 g/dL (ref 11.1–15.9)
Immature Grans (Abs): 0 x10E3/uL (ref 0.0–0.1)
Immature Granulocytes: 0 %
Lymphocytes Absolute: 1.1 x10E3/uL (ref 0.7–3.1)
Lymphs: 13 %
MCH: 30.8 pg (ref 26.6–33.0)
MCHC: 32.8 g/dL (ref 31.5–35.7)
MCV: 94 fL (ref 79–97)
Monocytes Absolute: 0.6 x10E3/uL (ref 0.1–0.9)
Monocytes: 7 %
Neutrophils Absolute: 6.9 x10E3/uL (ref 1.4–7.0)
Neutrophils: 78 %
Platelets: 255 x10E3/uL (ref 150–450)
RBC: 5.09 x10E6/uL (ref 3.77–5.28)
RDW: 11.9 % (ref 11.7–15.4)
WBC: 8.9 x10E3/uL (ref 3.4–10.8)

## 2024-03-12 LAB — LIPID PANEL
Chol/HDL Ratio: 2.6 ratio (ref 0.0–4.4)
Cholesterol, Total: 134 mg/dL (ref 100–199)
HDL: 51 mg/dL (ref >39)
LDL Chol Calc (NIH): 57 mg/dL (ref 0–99)
Triglycerides: 152 mg/dL — ABNORMAL HIGH (ref 0–149)
VLDL Cholesterol Cal: 26 mg/dL (ref 5–40)

## 2024-03-12 LAB — COMPREHENSIVE METABOLIC PANEL WITH GFR
ALT: 35 IU/L — ABNORMAL HIGH (ref 0–32)
AST: 25 IU/L (ref 0–40)
Albumin: 4.5 g/dL (ref 3.9–4.9)
Alkaline Phosphatase: 105 IU/L (ref 44–121)
BUN/Creatinine Ratio: 14 (ref 12–28)
BUN: 13 mg/dL (ref 8–27)
Bilirubin Total: 0.6 mg/dL (ref 0.0–1.2)
CO2: 20 mmol/L (ref 20–29)
Calcium: 9.9 mg/dL (ref 8.7–10.3)
Chloride: 104 mmol/L (ref 96–106)
Creatinine, Ser: 0.92 mg/dL (ref 0.57–1.00)
Globulin, Total: 2.1 g/dL (ref 1.5–4.5)
Glucose: 117 mg/dL — ABNORMAL HIGH (ref 70–99)
Potassium: 4.7 mmol/L (ref 3.5–5.2)
Sodium: 141 mmol/L (ref 134–144)
Total Protein: 6.6 g/dL (ref 6.0–8.5)
eGFR: 69 mL/min/1.73 (ref >59)

## 2024-03-12 LAB — VITAMIN D 25 HYDROXY (VIT D DEFICIENCY, FRACTURES): Vit D, 25-Hydroxy: 32.7 ng/mL (ref 30.0–100.0)

## 2024-03-12 LAB — HEPATITIS C ANTIBODY: Hep C Virus Ab: NONREACTIVE

## 2024-03-12 LAB — HEMOGLOBIN A1C
Est. average glucose Bld gHb Est-mCnc: 126 mg/dL
Hgb A1c MFr Bld: 6 % — ABNORMAL HIGH (ref 4.8–5.6)

## 2024-03-12 LAB — TSH: TSH: 2.24 u[IU]/mL (ref 0.450–4.500)

## 2024-03-15 ENCOUNTER — Ambulatory Visit: Payer: Self-pay

## 2024-03-17 ENCOUNTER — Encounter

## 2024-03-18 ENCOUNTER — Ambulatory Visit (INDEPENDENT_AMBULATORY_CARE_PROVIDER_SITE_OTHER)

## 2024-03-18 VITALS — BP 117/75 | HR 74 | Ht 68.5 in | Wt 186.8 lb

## 2024-03-18 DIAGNOSIS — F410 Panic disorder [episodic paroxysmal anxiety] without agoraphobia: Secondary | ICD-10-CM

## 2024-03-18 DIAGNOSIS — M159 Polyosteoarthritis, unspecified: Secondary | ICD-10-CM

## 2024-03-18 DIAGNOSIS — G4700 Insomnia, unspecified: Secondary | ICD-10-CM

## 2024-03-18 DIAGNOSIS — Z0000 Encounter for general adult medical examination without abnormal findings: Secondary | ICD-10-CM | POA: Diagnosis not present

## 2024-03-18 MED ORDER — MELOXICAM 15 MG PO TABS: 15.0000 mg | ORAL_TABLET | Freq: Every day | ORAL | 0 refills | Status: DC

## 2024-03-18 MED ORDER — TRAZODONE HCL 100 MG PO TABS: ORAL_TABLET | ORAL | 1 refills | Status: DC

## 2024-03-18 MED ORDER — AMITRIPTYLINE HCL 10 MG PO TABS: 10.0000 mg | ORAL_TABLET | Freq: Every day | ORAL | 1 refills | Status: DC

## 2024-03-18 NOTE — Assessment & Plan Note (Signed)
 Stable, refill of Meloxicam  15 mg provided today. Recommend to continue with regular physical activity to support mobility.

## 2024-03-18 NOTE — Assessment & Plan Note (Signed)
 Chronic insomnia with difficulty sleeping and frequent awakenings. Discussed potential switch from trazodone  to amitriptyline .  Will plan for following trazodone  taper:  Week 1:  -Take 100 mg Trazodone  nightly  - Start Amitriptyline  10 mg nightly   Week 2:  -Take 50 mg Trazodone  nightly  - Take Amitriptyline  20 mg nightly  Week 3:  -Discontinue Trazodone   - Continue with Amitriptyline  20 mg nightly   - Instruct to taper trazodone  dose before switching to amitriptyline . If ineffective, return to trazodone . - Follow up in 3 months to assess sleep management.

## 2024-03-18 NOTE — Progress Notes (Signed)
 Complete physical exam  Patient: Robyn Roberts   DOB: [birth year]   68 y.o. Female  MRN: [id number]  Subjective:    Chief Complaint  Patient presents with   Annual Exam   History of Present Illness   Robyn Roberts is a 68 year old female who presents for a routine physical exam and medication refills. She reports that she is overall feeling well. She is sleeping poorly. She takes Trazodone  150 mg nightly but reports that she has trouble turning her brain off at night time. Waking up every 1-2 hours nightly. She is eating well balanced. She is not currently exercising regularly, but has hopes to start exercising more through walking outside now that the weather is cooling down. Reports regular bowel movements as long as she uses Miralax .   Sleep disturbance and nocturnal symptoms - Snoring and nasal congestion attributed to allergies - Poor sleep quality with awakening every two to three hours - Severe panic attacks in closed spaces - No daytime fatigue - Currently taking trazodone  at a dose of one and a half pills for sleep (150 mg) - No prior use of other sleep medications     Most recent fall risk assessment:    03/18/2024    9:32 AM  Fall Risk   Falls in the past year? 0  Number falls in past yr: 0  Injury with Fall? 0  Risk for fall due to : No Fall Risks  Follow up Falls evaluation completed     Most recent depression screenings:    03/18/2024    9:32 AM 09/18/2023   10:32 AM  PHQ 2/9 Scores  PHQ - 2 Score 0 0  PHQ- 9 Score 3 0    Dental: No current dental problems and Receives regular dental care    Patient Care Team: Gayle Saddie JULIANNA DEVONNA as PCP - General (Physician Assistant) Stuart Norris, NP as Nurse Practitioner (Obstetrics and Gynecology) Ishmael Slough, MD as Consulting Physician (Rheumatology) Kristie Lamprey, MD as Consulting Physician (Gastroenterology)   Outpatient Medications Prior to Visit  Medication Sig   ALPRAZolam  (XANAX ) 0.25 MG tablet  Take 10.5-1 tablet (0.125-0.25 mg total) by mouth 2 (two) times daily as needed for anxiety.   famotidine  (PEPCID ) 20 MG tablet Take 1 tablet (20 mg total) by mouth at bedtime.   ibuprofen (ADVIL) 200 MG tablet Take 600 mg by mouth every 6 (six) hours as needed for moderate pain.   loratadine (CLARITIN) 10 MG tablet Take 10 mg by mouth daily.   MULTIPLE VITAMIN PO Take 1 tablet by mouth daily.   polyethylene glycol powder (GLYCOLAX /MIRALAX ) 17 GM/SCOOP powder Take 17 g by mouth 2 (two) times daily as needed. (Patient taking differently: Take 17 g by mouth daily.)   [DISCONTINUED] meloxicam  (MOBIC ) 15 MG tablet Take 1 tablet (15 mg total) by mouth daily.   [DISCONTINUED] traZODone  (DESYREL ) 100 MG tablet TAKE 1 & 1/2 (ONE & ONE-HALF) TABLETS BY MOUTH AT BEDTIME AS NEEDED FOR SLEEP   [DISCONTINUED] amoxicillin -clavulanate (AUGMENTIN ) 875-125 MG tablet Take 1 tablet by mouth 2 (two) times daily.   [DISCONTINUED] olopatadine  (PATANOL) 0.1 % ophthalmic solution Place 1 drop into both eyes 2 (two) times daily.   No facility-administered medications prior to visit.    ROS  Per HPI      Objective:     BP 117/75   Pulse 74   Ht 5' 8.5 (1.74 m)   Wt 186 lb 12 oz (84.7 kg)   SpO2  96%   BMI 27.98 kg/m    Physical Exam Constitutional:      General: She is not in acute distress.    Appearance: Normal appearance.  HENT:     Right Ear: Tympanic membrane normal.     Left Ear: Tympanic membrane normal.     Mouth/Throat:     Mouth: Mucous membranes are moist.     Pharynx: Oropharynx is clear.  Eyes:     Pupils: Pupils are equal, round, and reactive to light.  Cardiovascular:     Rate and Rhythm: Normal rate and regular rhythm.     Heart sounds: Normal heart sounds. No murmur heard.    No friction rub. No gallop.  Pulmonary:     Effort: Pulmonary effort is normal. No respiratory distress.     Breath sounds: Normal breath sounds.  Abdominal:     General: Abdomen is flat. Bowel sounds  are normal.     Palpations: Abdomen is soft.  Musculoskeletal:        General: No swelling.     Cervical back: Normal range of motion.  Lymphadenopathy:     Cervical: No cervical adenopathy.  Skin:    General: Skin is warm and dry.  Neurological:     General: No focal deficit present.     Mental Status: She is alert.  Psychiatric:        Mood and Affect: Mood normal.        Behavior: Behavior normal.        Thought Content: Thought content normal.     No results found for any visits on 03/18/24. Last CBC Lab Results  Component Value Date   WBC 8.9 03/11/2024   HGB 15.7 03/11/2024   HCT 47.9 (H) 03/11/2024   MCV 94 03/11/2024   MCH 30.8 03/11/2024   RDW 11.9 03/11/2024   PLT 255 03/11/2024   Last metabolic panel Lab Results  Component Value Date   GLUCOSE 117 (H) 03/11/2024   NA 141 03/11/2024   K 4.7 03/11/2024   CL 104 03/11/2024   CO2 20 03/11/2024   BUN 13 03/11/2024   CREATININE 0.92 03/11/2024   EGFR 69 03/11/2024   CALCIUM 9.9 03/11/2024   PROT 6.6 03/11/2024   ALBUMIN 4.5 03/11/2024   LABGLOB 2.1 03/11/2024   AGRATIO 2.4 (H) 03/13/2022   BILITOT 0.6 03/11/2024   ALKPHOS 105 03/11/2024   AST 25 03/11/2024   ALT 35 (H) 03/11/2024   ANIONGAP 9 12/01/2021   Last lipids Lab Results  Component Value Date   CHOL 134 03/11/2024   HDL 51 03/11/2024   LDLCALC 57 03/11/2024   TRIG 152 (H) 03/11/2024   CHOLHDL 2.6 03/11/2024   Last hemoglobin A1c Lab Results  Component Value Date   HGBA1C 6.0 (H) 03/11/2024   Last thyroid  functions Lab Results  Component Value Date   TSH 2.240 03/11/2024   T3TOTAL 132 05/12/2019   Last vitamin D  Lab Results  Component Value Date   VD25OH 32.7 03/11/2024        Assessment & Plan:    Routine Health Maintenance and Physical Exam  Health Maintenance  Topic Date Due   Pneumococcal Vaccine for age over 64 (1 of 1 - PCV) Never done   COVID-19 Vaccine (3 - Moderna risk series) 09/16/2019   Medicare Annual  Wellness Visit  03/15/2024   Flu Shot  02/19/2024   Mammogram  01/04/2026   Colon Cancer Screening  09/23/2026   DTaP/Tdap/Td vaccine (5 -  Td or Tdap) 05/11/2029   DEXA scan (bone density measurement)  Completed   Hepatitis C Screening  Completed   Zoster (Shingles) Vaccine  Completed   HPV Vaccine  Aged Out   Meningitis B Vaccine  Aged Out    Discussed health benefits of physical activity, and encouraged her to engage in regular exercise appropriate for her age and condition.  Wellness examination  Insomnia, unspecified type Assessment & Plan: Chronic insomnia with difficulty sleeping and frequent awakenings. Discussed potential switch from trazodone  to amitriptyline .  Will plan for following trazodone  taper:  Week 1:  -Take 100 mg Trazodone  nightly  - Start Amitriptyline  10 mg nightly   Week 2:  -Take 50 mg Trazodone  nightly  - Take Amitriptyline  20 mg nightly  Week 3:  -Discontinue Trazodone   - Continue with Amitriptyline  20 mg nightly   - Instruct to taper trazodone  dose before switching to amitriptyline . If ineffective, return to trazodone . - Follow up in 3 months to assess sleep management.  Orders: -     traZODone  HCl; TAKE 1 & 1/2 (ONE & ONE-HALF) TABLETS BY MOUTH AT BEDTIME AS NEEDED FOR SLEEP  Dispense: 135 tablet; Refill: 1  Generalized OA- hands, back, neck, hips, knees Assessment & Plan: Stable, refill of Meloxicam  15 mg provided today. Recommend to continue with regular physical activity to support mobility.   Panic attacks Assessment & Plan: Stable and more well controlled now that she is on medication. Continue with Xanax  0.125-0.25 mg as needed up to twice a day.    Other orders -     Meloxicam ; Take 1 tablet (15 mg total) by mouth daily.  Dispense: 90 tablet; Refill: 0 -     Amitriptyline  HCl; Take 1 tablet (10 mg total) by mouth at bedtime.  Dispense: 30 tablet; Refill: 1   Return in about 3 months (around 06/18/2024) for Sleep.     Saddie JULIANNA Sacks, PA-C

## 2024-03-18 NOTE — Patient Instructions (Signed)
 VISIT SUMMARY: Today, you had a routine physical exam and discussed several health concerns including sleep disturbances, metabolic findings, musculoskeletal symptoms, gastrointestinal function, and your ophthalmologic status. Your thyroid  function and cholesterol panel are normal, but your triglycerides are slightly elevated. Your A1c has improved, and your ALT and hematocrit levels are mildly elevated. We also reviewed your current medications and discussed potential changes.  YOUR PLAN: -ADULT WELLNESS VISIT: Your routine wellness visit showed normal thyroid  and cholesterol levels, slightly elevated triglycerides, improved A1c, and mildly elevated ALT and hematocrit. Continue managing your triglycerides with a diet lower in sugar, monitor liver enzymes and hematocrit every few months, and maintain diet and exercise for prediabetes management. We will follow up in 3 months to review your sleep and other concerns.  -INSOMNIA: You have chronic insomnia with difficulty sleeping and frequent awakenings. We discussed switching from trazodone  to amitriptyline  for sleep. You will continue trazodone  with a taper and start amitriptyline  with a 30-day supply. Taper the trazodone  dose before switching to amitriptyline . If amitriptyline  is ineffective, you can return to trazodone . We will follow up in 3 months to assess your sleep management.  -PANIC ATTACKS WITH CLAUSTROPHOBIA: You experience severe panic attacks with claustrophobia, especially concerning potential CPAP use. Since you do not have daytime fatigue, we will monitor your symptoms and consider a sleep study if they worsen.  INSTRUCTIONS: Please schedule a follow-up appointment in 3 months to review your sleep and other concerns. Continue with your current management plan and monitor your symptoms as discussed.  If you have any problems before your next visit feel free to message me via MyChart (minor issues or questions) or call the office, otherwise  you may reach out to schedule an office visit.  Thank you! Saddie Sacks, PA-C

## 2024-03-18 NOTE — Assessment & Plan Note (Signed)
 Stable and more well controlled now that she is on medication. Continue with Xanax  0.125-0.25 mg as needed up to twice a day.

## 2024-03-23 ENCOUNTER — Ambulatory Visit (INDEPENDENT_AMBULATORY_CARE_PROVIDER_SITE_OTHER)

## 2024-03-23 DIAGNOSIS — Z23 Encounter for immunization: Secondary | ICD-10-CM

## 2024-03-23 NOTE — Progress Notes (Signed)
 Patient is here for her Immunizations pt tolerated injection

## 2024-04-06 ENCOUNTER — Other Ambulatory Visit: Payer: Self-pay

## 2024-04-06 DIAGNOSIS — E2839 Other primary ovarian failure: Secondary | ICD-10-CM | POA: Diagnosis not present

## 2024-04-06 DIAGNOSIS — Z780 Asymptomatic menopausal state: Secondary | ICD-10-CM | POA: Diagnosis not present

## 2024-04-06 DIAGNOSIS — G4700 Insomnia, unspecified: Secondary | ICD-10-CM

## 2024-04-06 MED ORDER — TRAZODONE HCL 100 MG PO TABS: ORAL_TABLET | ORAL | 1 refills | Status: AC

## 2024-04-06 MED ORDER — RAMELTEON 8 MG PO TABS: 8.0000 mg | ORAL_TABLET | Freq: Every day | ORAL | 2 refills | Status: DC

## 2024-04-06 NOTE — Addendum Note (Signed)
 Addended byBETHA GAYLE NUMBERS on: 04/06/2024 12:37 PM   Modules accepted: Orders

## 2024-06-08 ENCOUNTER — Telehealth: Admitting: Physician Assistant

## 2024-06-08 DIAGNOSIS — H8113 Benign paroxysmal vertigo, bilateral: Secondary | ICD-10-CM

## 2024-06-08 DIAGNOSIS — B9689 Other specified bacterial agents as the cause of diseases classified elsewhere: Secondary | ICD-10-CM | POA: Diagnosis not present

## 2024-06-08 DIAGNOSIS — J019 Acute sinusitis, unspecified: Secondary | ICD-10-CM | POA: Diagnosis not present

## 2024-06-08 MED ORDER — DOXYCYCLINE HYCLATE 100 MG PO TABS: 100.0000 mg | ORAL_TABLET | Freq: Two times a day (BID) | ORAL | 0 refills | Status: DC

## 2024-06-08 MED ORDER — FLUTICASONE PROPIONATE 50 MCG/ACT NA SUSP: 2.0000 | Freq: Every day | NASAL | 0 refills | Status: AC

## 2024-06-08 MED ORDER — MECLIZINE HCL 12.5 MG PO TABS: 12.5000 mg | ORAL_TABLET | Freq: Three times a day (TID) | ORAL | 0 refills | Status: DC | PRN

## 2024-06-08 NOTE — Patient Instructions (Signed)
 Dorothe KANDICE Dillon, thank you for joining [name], PA-C for today's virtual visit.  While this provider is not your primary care provider (PCP), if your PCP is located in our provider database this encounter information will be shared with them immediately following your visit.   A [facility] MyChart account gives you access to today's visit and all your visits, tests, and labs performed at [facility]  click here if you don't have a [facility] MyChart account or go to [email or web address]  Consent: (Patient) Robyn Roberts provided verbal consent for this virtual visit at the beginning of the encounter.  Current Medications:  Current Outpatient Medications:    ALPRAZolam  (XANAX ) 0.25 MG tablet, Take 10.5-1 tablet (0.125-0.25 mg total) by mouth 2 (two) times daily as needed for anxiety., Disp: 20 tablet, Rfl: 0   famotidine  (PEPCID ) 20 MG tablet, Take 1 tablet (20 mg total) by mouth at bedtime., Disp: 90 tablet, Rfl: 3   ibuprofen (ADVIL) 200 MG tablet, Take 600 mg by mouth every 6 (six) hours as needed for moderate pain., Disp: , Rfl:    loratadine (CLARITIN) 10 MG tablet, Take 10 mg by mouth daily., Disp: , Rfl:    meloxicam  (MOBIC ) 15 MG tablet, Take 1 tablet (15 mg total) by mouth daily., Disp: 90 tablet, Rfl: 0   MULTIPLE VITAMIN PO, Take 1 tablet by mouth daily., Disp: , Rfl:    polyethylene glycol powder (GLYCOLAX /MIRALAX ) 17 GM/SCOOP powder, Take 17 g by mouth 2 (two) times daily as needed. (Patient taking differently: Take 17 g by mouth daily.), Disp: 3350 g, Rfl: 1   ramelteon  (ROZEREM ) 8 MG tablet, Take 1 tablet (8 mg total) by mouth at bedtime., Disp: 30 tablet, Rfl: 2   traZODone  (DESYREL ) 100 MG tablet, TAKE 1 & 1/2 (ONE & ONE-HALF) TABLETS BY MOUTH AT BEDTIME AS NEEDED FOR SLEEP, Disp: 135 tablet, Rfl: 1   Medications ordered in this encounter:  No orders of the defined types were placed in this encounter.    *If you need refills on other  medications prior to your next appointment, please contact your pharmacy*  Follow-Up: Call back or seek an in-person evaluation if the symptoms worsen or if the condition fails to improve as anticipated.  [facility] [phone]  Other Instructions Please take antibiotic as directed.  Increase fluid intake.  Use Saline nasal spray.  Take a daily multivitamin. Use the FLonase as directed along with your Claritin. The Meclizine is to help with any acute positional dizziness.  Place a humidifier in the bedroom.  If you note any non-resolving, new, or worsening symptoms despite treatment, please seek an in-person evaluation ASAP.  Sinusitis Sinusitis is redness, soreness, and swelling (inflammation) of the paranasal sinuses. Paranasal sinuses are air pockets within the bones of your face (beneath the eyes, the middle of the forehead, or above the eyes). In healthy paranasal sinuses, mucus is able to drain out, and air is able to circulate through them by way of your nose. However, when your paranasal sinuses are inflamed, mucus and air can become trapped. This can allow bacteria and other germs to grow and cause infection. Sinusitis can develop quickly and last only a short time (acute) or continue over a long period (chronic). Sinusitis that lasts for more than 12 weeks is considered chronic.  CAUSES  Causes of sinusitis include: Allergies. Structural abnormalities, such as displacement of the cartilage that separates your nostrils (deviated septum), which can decrease the air  flow through your nose and sinuses and affect sinus drainage. Functional abnormalities, such as when the small hairs (cilia) that line your sinuses and help remove mucus do not work properly or are not present. SYMPTOMS  Symptoms of acute and chronic sinusitis are the same. The primary symptoms are pain and pressure around the affected sinuses. Other symptoms include: Upper toothache. Earache. Headache. Bad  breath. Decreased sense of smell and taste. A cough, which worsens when you are lying flat. Fatigue. Fever. Thick drainage from your nose, which often is green and may contain pus (purulent). Swelling and warmth over the affected sinuses. DIAGNOSIS  Your caregiver will perform a physical exam. During the exam, your caregiver may: Look in your nose for signs of abnormal growths in your nostrils (nasal polyps). Tap over the affected sinus to check for signs of infection. View the inside of your sinuses (endoscopy) with a special imaging device with a light attached (endoscope), which is inserted into your sinuses. If your caregiver suspects that you have chronic sinusitis, one or more of the following tests may be recommended: Allergy tests. Nasal culture A sample of mucus is taken from your nose and sent to a lab and screened for bacteria. Nasal cytology A sample of mucus is taken from your nose and examined by your caregiver to determine if your sinusitis is related to an allergy. TREATMENT  Most cases of acute sinusitis are related to a viral infection and will resolve on their own within 10 days. Sometimes medicines are prescribed to help relieve symptoms (pain medicine, decongestants, nasal steroid sprays, or saline sprays).  However, for sinusitis related to a bacterial infection, your caregiver will prescribe antibiotic medicines. These are medicines that will help kill the bacteria causing the infection.  Rarely, sinusitis is caused by a fungal infection. In theses cases, your caregiver will prescribe antifungal medicine. For some cases of chronic sinusitis, surgery is needed. Generally, these are cases in which sinusitis recurs more than 3 times per year, despite other treatments. HOME CARE INSTRUCTIONS  Drink plenty of water. Water helps thin the mucus so your sinuses can drain more easily. Use a humidifier. Inhale steam 3 to 4 times a day (for example, sit in the bathroom with the  shower running). Apply a warm, moist washcloth to your face 3 to 4 times a day, or as directed by your caregiver. Use saline nasal sprays to help moisten and clean your sinuses. Take over-the-counter or prescription medicines for pain, discomfort, or fever only as directed by your caregiver. SEEK IMMEDIATE MEDICAL CARE IF: You have increasing pain or severe headaches. You have nausea, vomiting, or drowsiness. You have swelling around your face. You have vision problems. You have a stiff neck. You have difficulty breathing. MAKE SURE YOU:  Understand these instructions. Will watch your condition. Will get help right away if you are not doing well or get worse. Document Released: 07/07/2005 Document Revised: 09/29/2011 Document Reviewed: 07/22/2011 [facility] Patient Information 2014 [place], [place].    If you have been instructed to have an in-person evaluation today at a local Urgent Care facility, please use the link below. It will take you to a list of all of our available [facility], including address, phone number and hours of operation. Please do not delay care.  [facility]  If you or a family member do not have a primary care provider, use the link below to schedule a visit and establish care. When you choose a [facility] primary care physician or advanced practice provider, you gain a long-term partner in health. Find a Primary Care Provider  Learn more about [facility]'s in-office and virtual care options: [facility] - Get Care Now

## 2024-06-08 NOTE — Progress Notes (Signed)
 Virtual Visit Consent   Robyn Roberts, you are scheduled for a virtual visit with a [facility] provider today. Just as with appointments in the office, your consent must be obtained to participate. Your consent will be active for this visit and any virtual visit you may have with one of our providers in the next 365 days. If you have a MyChart account, a copy of this consent can be sent to you electronically.  As this is a virtual visit, video technology does not allow for your provider to perform a traditional examination. This may limit your provider's ability to fully assess your condition. If your provider identifies any concerns that need to be evaluated in person or the need to arrange testing (such as labs, EKG, etc.), we will make arrangements to do so. Although advances in technology are sophisticated, we cannot ensure that it will always work on either your end or our end. If the connection with a video visit is poor, the visit may have to be switched to a telephone visit. With either a video or telephone visit, we are not always able to ensure that we have a secure connection.  By engaging in this virtual visit, you consent to the provision of healthcare and authorize for your insurance to be billed (if applicable) for the services provided during this visit. Depending on your insurance coverage, you may receive a charge related to this service.  I need to obtain your verbal consent now. Are you willing to proceed with your visit today? Robyn Roberts has provided verbal consent on 06/08/2024 for a virtual visit (video or telephone). Robyn Roberts, [place]  Date: 06/08/2024 1:52 PM   Virtual Visit via Video Note   I, Robyn Roberts, connected with  Robyn Roberts  ([id number], [birth year]) on 06/08/24 at  1:45 PM EST by a video-enabled telemedicine application and verified that I am speaking with the correct person using two identifiers.  Location: Patient: Virtual Visit  Location Patient: Home Provider: Virtual Visit Location Provider: Home Office   I discussed the limitations of evaluation and management by telemedicine and the availability of in person appointments. The patient expressed understanding and agreed to proceed.    History of Present Illness: Robyn Roberts is a 68 y.o. who identifies as a female who was assigned female at birth, and is being seen today for nasal congestion associated with sinus pressure, ear pressure and popping. Mild maxillary sinus pain, worsening. Denies fever, chills. Denies recent travel or known sick contact. Does not occasional positional dizziness when turning head quickly -- true vertigo per patient. Resolves with sitting still.   OTC -- Dayquil, Mucinex   HPI: HPI  Problems:  Patient Active Problem List   Diagnosis Date Noted   Hypertriglyceridemia 09/18/2023   Panic attacks 12/10/2022   Seborrheic keratosis 12/06/2020   Gastroesophageal reflux disease 11/15/2019   Allergic conjunctivitis of both eyes 11/15/2019   Constipation 05/12/2019   NSAID long-term use 11/08/2017   Vitamin D  deficiency 10/29/2017   Glucose intolerance (impaired glucose tolerance) 10/29/2017   Perimenopausal- last mense 2009 05/19/2016   Osteoarthritis of carpometacarpal (CMC) joint of both thumbs L>er R 05/19/2016   Arthritis- b/l knees 05/19/2016   Environmental and seasonal allergies 04/05/2016   Generalized OA- hands, back, neck, hips, knees 04/05/2016   Overweight (BMI 25.0-29.9) 04/05/2016   Idiopathic scoliosis 04/03/2016   Chronic back pain 05/03/2015   Insomnia 12/10/2005   Difficulty hearing 11/12/2005    Allergies:  Allergies  Allergen Reactions   Aspirin Swelling   Sulfa Antibiotics Hives   Medications:  Current Outpatient Medications:    doxycycline (VIBRA-TABS) 100 MG tablet, Take 1 tablet (100 mg total) by mouth 2 (two) times daily., Disp: 14 tablet, Rfl: 0   fluticasone (FLONASE) 50 MCG/ACT nasal spray, Place 2  sprays into both nostrils daily., Disp: 16 g, Rfl: 0   meclizine (ANTIVERT) 12.5 MG tablet, Take 1 tablet (12.5 mg total) by mouth 3 (three) times daily as needed for dizziness., Disp: 30 tablet, Rfl: 0   ALPRAZolam  (XANAX ) 0.25 MG tablet, Take 10.5-1 tablet (0.125-0.25 mg total) by mouth 2 (two) times daily as needed for anxiety., Disp: 20 tablet, Rfl: 0   famotidine  (PEPCID ) 20 MG tablet, Take 1 tablet (20 mg total) by mouth at bedtime., Disp: 90 tablet, Rfl: 3   ibuprofen (ADVIL) 200 MG tablet, Take 600 mg by mouth every 6 (six) hours as needed for moderate pain., Disp: , Rfl:    loratadine (CLARITIN) 10 MG tablet, Take 10 mg by mouth daily., Disp: , Rfl:    meloxicam  (MOBIC ) 15 MG tablet, Take 1 tablet (15 mg total) by mouth daily., Disp: 90 tablet, Rfl: 0   MULTIPLE VITAMIN PO, Take 1 tablet by mouth daily., Disp: , Rfl:    polyethylene glycol powder (GLYCOLAX /MIRALAX ) 17 GM/SCOOP powder, Take 17 g by mouth 2 (two) times daily as needed. (Patient taking differently: Take 17 g by mouth daily.), Disp: 3350 g, Rfl: 1   traZODone  (DESYREL ) 100 MG tablet, TAKE 1 & 1/2 (ONE & ONE-HALF) TABLETS BY MOUTH AT BEDTIME AS NEEDED FOR SLEEP, Disp: 135 tablet, Rfl: 1  Observations/Objective: Patient is well-developed, well-nourished in no acute distress.  Resting comfortably  at home.  Head is normocephalic, atraumatic.  No labored breathing.  Speech is clear and coherent with logical content.  Patient is alert and oriented at baseline.   Assessment and Plan: 1. Acute bacterial sinusitis (Primary) - fluticasone (FLONASE) 50 MCG/ACT nasal spray; Place 2 sprays into both nostrils daily.  Dispense: 16 g; Refill: 0 - doxycycline (VIBRA-TABS) 100 MG tablet; Take 1 tablet (100 mg total) by mouth 2 (two) times daily.  Dispense: 14 tablet; Refill: 0  2. Benign paroxysmal positional vertigo due to bilateral vestibular disorder - fluticasone (FLONASE) 50 MCG/ACT nasal spray; Place 2 sprays into both nostrils  daily.  Dispense: 16 g; Refill: 0 - meclizine (ANTIVERT) 12.5 MG tablet; Take 1 tablet (12.5 mg total) by mouth 3 (three) times daily as needed for dizziness.  Dispense: 30 tablet; Refill: 0  Rx Doxycycline.  Increase fluids.  Rest.  Saline nasal spray.  Probiotic.  Mucinex  as directed.  Humidifier in bedroom. Flonase per orders. Restart Claritin once daily. Meclizine as directed, if needed.  In-person evaluation for any non-resolving, new or worsening symptoms despite treatment.    Follow Up Instructions: I discussed the assessment and treatment plan with the patient. The patient was provided an opportunity to ask questions and all were answered. The patient agreed with the plan and demonstrated an understanding of the instructions.  A copy of instructions were sent to the patient via MyChart unless otherwise noted below.   The patient was advised to call back or seek an in-person evaluation if the symptoms worsen or if the condition fails to improve as anticipated.    Robyn Velma Lunger, PA-C

## 2024-06-13 ENCOUNTER — Other Ambulatory Visit: Payer: Self-pay

## 2024-06-22 ENCOUNTER — Ambulatory Visit

## 2024-06-22 VITALS — BP 126/80 | HR 66 | Temp 97.7°F | Ht 68.5 in | Wt 189.1 lb

## 2024-06-22 DIAGNOSIS — H6991 Unspecified Eustachian tube disorder, right ear: Secondary | ICD-10-CM | POA: Insufficient documentation

## 2024-06-22 DIAGNOSIS — G4700 Insomnia, unspecified: Secondary | ICD-10-CM

## 2024-06-22 DIAGNOSIS — R200 Anesthesia of skin: Secondary | ICD-10-CM | POA: Diagnosis not present

## 2024-06-22 DIAGNOSIS — H8110 Benign paroxysmal vertigo, unspecified ear: Secondary | ICD-10-CM | POA: Diagnosis not present

## 2024-06-22 DIAGNOSIS — R202 Paresthesia of skin: Secondary | ICD-10-CM | POA: Diagnosis not present

## 2024-06-22 DIAGNOSIS — R7303 Prediabetes: Secondary | ICD-10-CM | POA: Diagnosis not present

## 2024-06-22 MED ORDER — AZELASTINE HCL 0.1 % NA SOLN: 1.0000 | Freq: Two times a day (BID) | NASAL | 5 refills | Status: AC

## 2024-06-22 NOTE — Assessment & Plan Note (Signed)
 ingling and numbness in lower calves and feet, possibly restless legs syndrome, neuropathy, or vitamin B12 deficiency. Occasional sharp pain in toes. - Ordered blood test for vitamin B12 levels. - Order nerve conduction study if B12 levels are normal. - Consider gabapentin if neuropathy is confirmed.

## 2024-06-22 NOTE — Patient Instructions (Signed)
 VISIT SUMMARY: During your visit, we discussed your ongoing vertigo and tingling in your lower extremities. We have adjusted your treatment plan to better address these issues and will follow up based on your progress.  YOUR PLAN: VERTIGO WITH MIDDLE EAR EFFUSION: You have been experiencing vertigo, likely due to fluid in your middle ear. Previous medications have not been effective. -Start using azelastine nasal spray after Flonase  in the morning and at bedtime. -If your vertigo persists after one week, we will refer you to physical therapy for the Epley maneuver.  NUMBNESS AND TINGLING OF BOTH FEET: You have been experiencing tingling and occasional sharp pains in your lower legs and feet, which could be due to restless legs syndrome, neuropathy, or a vitamin B12 deficiency. -We have ordered a blood test to check your vitamin B12 levels. -If your B12 levels are normal, we will order a nerve conduction study. -If neuropathy is confirmed, we may consider starting you on gabapentin.  GENERAL HEALTH MAINTENANCE: Your general health maintenance is up to date. -Continue with your current health maintenance schedule.  If you have any problems before your next visit feel free to message me via MyChart (minor issues or questions) or call the office, otherwise you may reach out to schedule an office visit.  Thank you! Saddie Sacks, PA-C

## 2024-06-22 NOTE — Assessment & Plan Note (Signed)
 Intermittent vertigo likely due to middle ear effusion. Previous doxycycline  and meclizine  ineffective. Possible otolith displacement. - Prescribed azelastine  nasal spray after Flonase  in the morning and at bedtime. - Refer to physical therapy for Epley maneuver if vertigo persists after one week.

## 2024-06-22 NOTE — Progress Notes (Signed)
 Established Patient Office Visit  Subjective   Patient ID: Robyn Roberts, female    DOB: 05-Feb-1956  Age: 68 y.o. MRN: [id number]  Chief Complaint  Patient presents with   Medication Management    HPI  History of Present Illness   Robyn Roberts is a 68 year old female who presents with vertigo and tingling in her lower extremities.  Vertigo - Vertigo present for approximately one month - Similar episode occurred one year ago and resolved spontaneously - Triggered by certain movements, resulting in dizziness - Meclizine  prescribed during a virtual visit without symptom relief - Flonase  nasal spray used regularly with some improvement, but vertigo remains bothersome  - Doxycycline  prescribed during virtual visit for concurrent URI which has resolved   Paresthesia of lower extremities - Tingling in lower calves and feet for approximately two months - Primarily occurs at night or when sitting for extended periods - Sensation described as similar to a limb 'going to sleep' - Occasional sharp, shooting pains in toes - No significant numbness          ROS Per HPI.    Objective:     BP 126/80   Pulse 66   Temp 97.7 F (36.5 C) (Oral)   Ht 5' 8.5 (1.74 m)   Wt 189 lb 1.9 oz (85.8 kg)   SpO2 97%   BMI 28.34 kg/m    Physical Exam Constitutional:      General: She is not in acute distress.    Appearance: Normal appearance.  HENT:     Right Ear: Hearing normal. A middle ear effusion is present.     Left Ear: Hearing and tympanic membrane normal.  Eyes:     Extraocular Movements:     Right eye: No nystagmus.     Left eye: No nystagmus.     Pupils: Pupils are equal, round, and reactive to light.  Cardiovascular:     Rate and Rhythm: Normal rate and regular rhythm.     Heart sounds: Normal heart sounds. No murmur heard.    No friction rub. No gallop.  Pulmonary:     Effort: Pulmonary effort is normal. No respiratory distress.     Breath sounds: Normal  breath sounds.  Abdominal:     General: Bowel sounds are normal.  Musculoskeletal:        General: No swelling.     Cervical back: Neck supple.  Lymphadenopathy:     Cervical: No cervical adenopathy.  Skin:    General: Skin is warm and dry.  Neurological:     General: No focal deficit present.     Mental Status: She is alert.  Psychiatric:        Mood and Affect: Mood normal.        Behavior: Behavior normal.        Thought Content: Thought content normal.      No results found for any visits on 06/22/24.    The 10-year ASCVD risk score (Arnett DK, et al., 2019) is: 5.8%    Assessment & Plan:   Prediabetes -     Hemoglobin A1c; Future  Numbness and tingling of both feet Assessment & Plan: ingling and numbness in lower calves and feet, possibly restless legs syndrome, neuropathy, or vitamin B12 deficiency. Occasional sharp pain in toes. - Ordered blood test for vitamin B12 levels. - Order nerve conduction study if B12 levels are normal. - Consider gabapentin if neuropathy is confirmed.  Orders: -  B12 and Folate Panel; Future -     Iron, TIBC and Ferritin Panel; Future -     Comprehensive metabolic panel with GFR; Future  Eustachian tube dysfunction, right Assessment & Plan: Intermittent vertigo likely due to middle ear effusion. Previous doxycycline  and meclizine  ineffective. Possible otolith displacement. - Prescribed azelastine nasal spray after Flonase  in the morning and at bedtime to treat fluid behind ear drum. If vertigo does not resolve after 1 week of azelastine, will refer to physical therapy for Epley maneuver.   Benign paroxysmal positional vertigo, unspecified laterality Assessment & Plan: Intermittent vertigo likely due to middle ear effusion. Previous doxycycline  and meclizine  ineffective. Possible otolith displacement. - Prescribed azelastine nasal spray after Flonase  in the morning and at bedtime. - Refer to physical therapy for Epley maneuver  if vertigo persists after one week.   Insomnia, unspecified type Assessment & Plan: Sleeping well with Trazodone  100 mg nightly again as trial of amitriptyline  failed and Ramelteon  was too expensive.    Other orders -     Azelastine HCl; Place 1 spray into both nostrils 2 (two) times daily. Use in each nostril as directed  Dispense: 30 mL; Refill: 5       Return in about 6 months (around 12/21/2024) for Pre-DM, sleep, Med check.    Saddie JULIANNA Sacks, PA-C

## 2024-06-22 NOTE — Assessment & Plan Note (Signed)
 Sleeping well with Trazodone  100 mg nightly again as trial of amitriptyline  failed and Ramelteon  was too expensive.

## 2024-06-22 NOTE — Assessment & Plan Note (Signed)
 Intermittent vertigo likely due to middle ear effusion. Previous doxycycline  and meclizine  ineffective. Possible otolith displacement. - Prescribed azelastine nasal spray after Flonase  in the morning and at bedtime to treat fluid behind ear drum. If vertigo does not resolve after 1 week of azelastine, will refer to physical therapy for Epley maneuver.

## 2024-06-23 LAB — COMPREHENSIVE METABOLIC PANEL WITH GFR
ALT: 34 IU/L — ABNORMAL HIGH (ref 0–32)
AST: 25 IU/L (ref 0–40)
Albumin: 4.5 g/dL (ref 3.9–4.9)
Alkaline Phosphatase: 93 IU/L (ref 49–135)
BUN/Creatinine Ratio: 19 (ref 12–28)
BUN: 15 mg/dL (ref 8–27)
Bilirubin Total: 0.5 mg/dL (ref 0.0–1.2)
CO2: 23 mmol/L (ref 20–29)
Calcium: 9.4 mg/dL (ref 8.7–10.3)
Chloride: 104 mmol/L (ref 96–106)
Creatinine, Ser: 0.8 mg/dL (ref 0.57–1.00)
Globulin, Total: 1.9 g/dL (ref 1.5–4.5)
Glucose: 94 mg/dL (ref 70–99)
Potassium: 4.8 mmol/L (ref 3.5–5.2)
Sodium: 142 mmol/L (ref 134–144)
Total Protein: 6.4 g/dL (ref 6.0–8.5)
eGFR: 81 mL/min/1.73 (ref >59)

## 2024-06-23 LAB — HEMOGLOBIN A1C
Est. average glucose Bld gHb Est-mCnc: 126 mg/dL
Hgb A1c MFr Bld: 6 % — ABNORMAL HIGH (ref 4.8–5.6)

## 2024-06-23 LAB — B12 AND FOLATE PANEL
Folate: 19.1 ng/mL (ref >3.0)
Vitamin B-12: 582 pg/mL (ref 232–1245)

## 2024-06-23 LAB — IRON,TIBC AND FERRITIN PANEL
Ferritin: 242 ng/mL — ABNORMAL HIGH (ref 15–150)
Iron Saturation: 27 % (ref 15–55)
Iron: 94 ug/dL (ref 27–139)
Total Iron Binding Capacity: 348 ug/dL (ref 250–450)
UIBC: 254 ug/dL (ref 118–369)

## 2024-06-27 ENCOUNTER — Ambulatory Visit: Payer: Self-pay

## 2024-06-27 DIAGNOSIS — R42 Dizziness and giddiness: Secondary | ICD-10-CM

## 2024-06-27 DIAGNOSIS — G6289 Other specified polyneuropathies: Secondary | ICD-10-CM

## 2024-06-28 MED ORDER — GABAPENTIN 100 MG PO CAPS: 100.0000 mg | ORAL_CAPSULE | Freq: Every day | ORAL | 1 refills | Status: DC

## 2024-07-18 MED ORDER — GABAPENTIN 300 MG PO CAPS: 300.0000 mg | ORAL_CAPSULE | Freq: Every day | ORAL | 2 refills | Status: AC

## 2024-07-18 NOTE — Addendum Note (Signed)
 Addended byBETHA GAYLE NUMBERS on: 07/18/2024 05:11 PM   Modules accepted: Orders

## 2024-12-21 ENCOUNTER — Ambulatory Visit
